# Patient Record
Sex: Female | Born: 1935 | Race: White | Hispanic: No | Smoking: Former smoker
Health system: Southern US, Community
[De-identification: ages and names within clinical notes are randomized; demographics above are authoritative.]

## PROBLEM LIST (undated history)

## (undated) ENCOUNTER — Emergency Department (HOSPITAL_COMMUNITY): Discharge status: Against Medical Advice | Payer: Self-pay

## (undated) DIAGNOSIS — J9801 Acute bronchospasm: Secondary | ICD-10-CM

## (undated) DIAGNOSIS — E785 Hyperlipidemia, unspecified: Secondary | ICD-10-CM

## (undated) DIAGNOSIS — R0489 Hemorrhage from other sites in respiratory passages: Secondary | ICD-10-CM

## (undated) DIAGNOSIS — I10 Essential (primary) hypertension: Secondary | ICD-10-CM

## (undated) DIAGNOSIS — B029 Zoster without complications: Secondary | ICD-10-CM

## (undated) DIAGNOSIS — T7840XA Allergy, unspecified, initial encounter: Secondary | ICD-10-CM

## (undated) HISTORY — DX: Hemorrhage from other sites in respiratory passages: R04.89

## (undated) HISTORY — DX: Zoster without complications: B02.9

## (undated) HISTORY — DX: Allergy, unspecified, initial encounter: T78.40XA

## (undated) HISTORY — DX: Acute bronchospasm: J98.01

---

## 1997-10-12 ENCOUNTER — Ambulatory Visit (HOSPITAL_COMMUNITY): Admission: RE | Admit: 1997-10-12 | Discharge: 1997-10-12 | Payer: Self-pay | Admitting: Obstetrics & Gynecology

## 1998-07-19 ENCOUNTER — Other Ambulatory Visit: Admission: RE | Admit: 1998-07-19 | Discharge: 1998-07-19 | Payer: Self-pay | Admitting: *Deleted

## 1999-08-23 ENCOUNTER — Other Ambulatory Visit: Admission: RE | Admit: 1999-08-23 | Discharge: 1999-08-23 | Payer: Self-pay | Admitting: *Deleted

## 2000-11-20 ENCOUNTER — Emergency Department (HOSPITAL_COMMUNITY): Admission: EM | Admit: 2000-11-20 | Discharge: 2000-11-20 | Payer: Self-pay | Admitting: Internal Medicine

## 2000-12-12 ENCOUNTER — Ambulatory Visit (HOSPITAL_COMMUNITY): Admission: RE | Admit: 2000-12-12 | Discharge: 2000-12-12 | Payer: Self-pay | Admitting: Family Medicine

## 2001-07-15 DIAGNOSIS — R0489 Hemorrhage from other sites in respiratory passages: Secondary | ICD-10-CM

## 2001-07-15 HISTORY — DX: Hemorrhage from other sites in respiratory passages: R04.89

## 2001-11-26 ENCOUNTER — Encounter: Payer: Self-pay | Admitting: Emergency Medicine

## 2001-11-26 ENCOUNTER — Emergency Department (HOSPITAL_COMMUNITY): Admission: EM | Admit: 2001-11-26 | Discharge: 2001-11-26 | Payer: Self-pay | Admitting: Emergency Medicine

## 2001-11-27 ENCOUNTER — Encounter (INDEPENDENT_AMBULATORY_CARE_PROVIDER_SITE_OTHER): Payer: Self-pay | Admitting: Specialist

## 2001-11-27 ENCOUNTER — Encounter: Payer: Self-pay | Admitting: Internal Medicine

## 2001-11-27 ENCOUNTER — Encounter (INDEPENDENT_AMBULATORY_CARE_PROVIDER_SITE_OTHER): Payer: Self-pay | Admitting: *Deleted

## 2001-11-27 ENCOUNTER — Inpatient Hospital Stay (HOSPITAL_COMMUNITY): Admission: EM | Admit: 2001-11-27 | Discharge: 2001-12-08 | Payer: Self-pay | Admitting: Emergency Medicine

## 2001-11-28 ENCOUNTER — Encounter: Payer: Self-pay | Admitting: Pulmonary Disease

## 2001-11-28 ENCOUNTER — Encounter: Payer: Self-pay | Admitting: Thoracic Surgery

## 2001-11-29 ENCOUNTER — Encounter: Payer: Self-pay | Admitting: Internal Medicine

## 2001-11-29 ENCOUNTER — Encounter: Payer: Self-pay | Admitting: Thoracic Surgery

## 2001-11-30 ENCOUNTER — Encounter: Payer: Self-pay | Admitting: Pulmonary Disease

## 2001-12-01 ENCOUNTER — Encounter: Payer: Self-pay | Admitting: Pulmonary Disease

## 2001-12-02 ENCOUNTER — Encounter: Payer: Self-pay | Admitting: Pulmonary Disease

## 2001-12-03 ENCOUNTER — Encounter: Payer: Self-pay | Admitting: Internal Medicine

## 2001-12-05 ENCOUNTER — Encounter: Payer: Self-pay | Admitting: Internal Medicine

## 2001-12-06 ENCOUNTER — Encounter: Payer: Self-pay | Admitting: Pulmonary Disease

## 2002-05-18 ENCOUNTER — Encounter: Admission: RE | Admit: 2002-05-18 | Discharge: 2002-05-18 | Payer: Self-pay | Admitting: Internal Medicine

## 2002-05-18 ENCOUNTER — Encounter: Payer: Self-pay | Admitting: Internal Medicine

## 2003-06-26 ENCOUNTER — Emergency Department (HOSPITAL_COMMUNITY): Admission: EM | Admit: 2003-06-26 | Discharge: 2003-06-26 | Payer: Self-pay | Admitting: Emergency Medicine

## 2003-07-26 ENCOUNTER — Other Ambulatory Visit: Admission: RE | Admit: 2003-07-26 | Discharge: 2003-07-26 | Payer: Self-pay | Admitting: Gynecology

## 2004-06-12 ENCOUNTER — Ambulatory Visit: Payer: Self-pay | Admitting: Pulmonary Disease

## 2004-07-20 ENCOUNTER — Ambulatory Visit: Payer: Self-pay | Admitting: Pulmonary Disease

## 2004-07-27 ENCOUNTER — Ambulatory Visit: Admission: RE | Admit: 2004-07-27 | Discharge: 2004-07-27 | Payer: Self-pay | Admitting: Pulmonary Disease

## 2004-08-09 ENCOUNTER — Ambulatory Visit: Payer: Self-pay | Admitting: Pulmonary Disease

## 2004-08-16 ENCOUNTER — Other Ambulatory Visit: Admission: RE | Admit: 2004-08-16 | Discharge: 2004-08-16 | Payer: Self-pay | Admitting: Obstetrics and Gynecology

## 2004-09-02 ENCOUNTER — Emergency Department (HOSPITAL_COMMUNITY): Admission: EM | Admit: 2004-09-02 | Discharge: 2004-09-03 | Payer: Self-pay | Admitting: Emergency Medicine

## 2006-09-17 ENCOUNTER — Ambulatory Visit: Payer: Self-pay | Admitting: Pulmonary Disease

## 2006-09-29 ENCOUNTER — Ambulatory Visit: Payer: Self-pay

## 2006-09-29 ENCOUNTER — Encounter: Payer: Self-pay | Admitting: Cardiology

## 2006-10-09 ENCOUNTER — Ambulatory Visit: Payer: Self-pay | Admitting: Pulmonary Disease

## 2006-11-13 ENCOUNTER — Ambulatory Visit: Payer: Self-pay | Admitting: Internal Medicine

## 2006-11-20 ENCOUNTER — Ambulatory Visit: Payer: Self-pay

## 2006-12-09 ENCOUNTER — Ambulatory Visit: Payer: Self-pay | Admitting: Internal Medicine

## 2006-12-18 ENCOUNTER — Ambulatory Visit: Payer: Self-pay | Admitting: Internal Medicine

## 2006-12-18 LAB — CONVERTED CEMR LAB
CO2: 31 meq/L (ref 19–32)
Calcium: 9.9 mg/dL (ref 8.4–10.5)
Chloride: 101 meq/L (ref 96–112)
Creatinine, Ser: 1 mg/dL (ref 0.4–1.2)
Glucose, Bld: 95 mg/dL (ref 70–99)

## 2006-12-24 ENCOUNTER — Ambulatory Visit: Payer: Self-pay | Admitting: Internal Medicine

## 2006-12-24 ENCOUNTER — Ambulatory Visit: Payer: Self-pay | Admitting: Cardiology

## 2006-12-24 LAB — CONVERTED CEMR LAB
CO2: 30 meq/L (ref 19–32)
Chloride: 98 meq/L (ref 96–112)
Creatinine, Ser: 0.9 mg/dL (ref 0.4–1.2)
Glucose, Bld: 101 mg/dL — ABNORMAL HIGH (ref 70–99)
Sodium: 136 meq/L (ref 135–145)

## 2007-09-07 ENCOUNTER — Ambulatory Visit: Payer: Self-pay | Admitting: Internal Medicine

## 2009-05-29 ENCOUNTER — Inpatient Hospital Stay (HOSPITAL_COMMUNITY): Admission: EM | Admit: 2009-05-29 | Discharge: 2009-05-31 | Payer: Self-pay | Admitting: Emergency Medicine

## 2009-09-19 ENCOUNTER — Encounter (INDEPENDENT_AMBULATORY_CARE_PROVIDER_SITE_OTHER): Payer: Self-pay | Admitting: *Deleted

## 2009-11-24 ENCOUNTER — Ambulatory Visit: Payer: Self-pay | Admitting: Emergency Medicine

## 2009-11-24 DIAGNOSIS — I10 Essential (primary) hypertension: Secondary | ICD-10-CM

## 2009-11-24 DIAGNOSIS — R042 Hemoptysis: Secondary | ICD-10-CM | POA: Insufficient documentation

## 2009-11-24 DIAGNOSIS — J449 Chronic obstructive pulmonary disease, unspecified: Secondary | ICD-10-CM

## 2009-11-24 DIAGNOSIS — J4489 Other specified chronic obstructive pulmonary disease: Secondary | ICD-10-CM | POA: Insufficient documentation

## 2009-11-24 DIAGNOSIS — E785 Hyperlipidemia, unspecified: Secondary | ICD-10-CM

## 2010-08-14 NOTE — Letter (Signed)
Summary: New Patient letter  [facility]  [address] [place], [place] [address]   Phone: [phone]  Fax: [phone]       09/19/2009 MRN: [id number]  [facility] [address] [place], [place]  [address]  Dear Ms. [name],  Welcome to the Gastroenterology Division at [facility].    You are scheduled to see Dr. Christella Hartigan on 10-16-09 at 2:30p.m. on the 3rd floor at [facility], [address].  We ask that you try to arrive at our office 15 minutes prior to your appointment time to allow for check-in.  We would like you to complete the enclosed self-administered evaluation form prior to your visit and bring it with you on the day of your appointment.  We will review it with you.  Also, please bring a complete list of all your medications or, if you prefer, bring the medication bottles and we will list them.  Please bring your insurance card so that we may make a copy of it.  If your insurance requires a referral to see a specialist, please bring your referral form from your primary care physician.  Co-payments are due at the time of your visit and may be paid by cash, check or credit card.     Your office visit will consist of a consult with your physician (includes a physical exam), any laboratory testing he/she may order, scheduling of any necessary diagnostic testing (e.g. x-ray, ultrasound, CT-scan), and scheduling of a procedure (e.g. Endoscopy, Colonoscopy) if required.  Please allow enough time on your schedule to allow for any/all of these possibilities.    If you cannot keep your appointment, please call [phone] to cancel or reschedule prior to your appointment date.  This allows [place] the opportunity to schedule an appointment for another patient in need of care.  If you do not cancel or reschedule by 5 p.m. the business day prior to your appointment date, you will be charged a $50.00 late cancellation/no-show fee.    Thank you for choosing  [facility] for your medical needs.  We appreciate the opportunity to care for you.  Please visit [place] at our website  to learn more about our practice.                     Sincerely,                                                             The Gastroenterology Division

## 2010-08-14 NOTE — Assessment & Plan Note (Signed)
Summary: hemoptysis   Visit Type:  Initial Consult Copy to:  self referral Primary Provider/Referring Provider:  Dr. Nicholos Johns  CC:  Pulmonary Consult for hemoptysis.  Former Publishing copy pt..  History of Present Illness: 77 yo former smoker, hx of HTN, prior episode of massive hemoptysis in 2003. Also with mild AFL documented in 09/2004 by spirometry. Never has benefitted from BD's. Tells me that she hasn't had any more hemoptysis or exacerbations since that hospitalization. FOB was unremarkable at that time.   Tells me that she had an episode of hemoptysis, about 1 tablespoon, two weeks ago. None since. No CP, now SOB. She didn't have any prodrome, no URI signs, occas cough but no increase from baseline.   Preventive Screening-Counseling & Management  Alcohol-Tobacco     Smoking Status: quit  Current Medications (verified): 1)  Lipitor 20 Mg Tabs (Atorvastatin Calcium) .Marland Kitchen.. 1 By Mouth Once Daily 2)  Multivitamins   Tabs (Multiple Vitamin) .Marland Kitchen.. 1 By Mouth Once Daily 3)  Avapro 300 Mg Tabs (Irbesartan) .... Once Daily 4)  Combigan 0.2-0.5 % Soln (Brimonidine Tartrate-Timolol) .Marland Kitchen.. 1 Gtt Each Eye Two Times A Day  Allergies (verified): 1)  ! * Ivp Dye 2)  ! Pcn  Past History:  Past Medical History: Hyperlipidemia Hypertension Probable transient global amnesia Obesity Branch retinal vein occlusion on the right eye Glaucoma Hx pulmonary hemorrhage 2003 resulting in VDRF      - FOB x 2 at that time negative      - ANA, ANCA, ESR unremarkable 2003  Family History: allergies-daughter brother deceased from lung ca stroke - sister  Social History: Patient states former smoker.  Quit in 2003.  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  Former Publishing copy pt. Comments Medications reviewed with patient Daytime contact number verified with patient. Gweneth Dimitri RN  Nov 24, 2009 9:40 AM    Physical Exam  General:  pleasant elderly woman, normal appearance and healthy appearing.  Overweight Head:  normocephalic and atraumatic Nose:  no deformity, discharge, inflammation, or lesions; no blood Mouth:  no deformity or lesions, normal posterior pharynx Neck:  no stridor Lungs:  clear bilaterally to auscultation and percussion Heart:  regular rate and rhythm, S1, S2 without murmurs, rubs, gallops, or clicks Abdomen:  not examined Extremities:  no clubbing, cyanosis, edema, or deformity noted Neurologic:  non-focal Skin:  intact without lesions or rashes Psych:  alert and cooperative; normal mood and affect; normal attention span and concentration   Impression & Recommendations:  Problem # 1:  HEMOPTYSIS UNSPECIFIED (ICD-786.30) Single episode 2 weeks ago in absence of CP or SOB. DDx includes PE, but doubt in absence of other symptoms. Also consider endobronchial lesion, malignancy. She does not have a bronchitis or other process that would seem to incite bleeding. Hx of massive hemoptysis in 2003 without abnormality on FOB.  We discussed options conservative vs aggressive - CT-PA and FOB now vs watch and wait to see if it happens again. She would like to wait on Ct or FOB at this time - CXR now - if bleeding occurs again then will arrange for FOB and probably CT-PA. - f/u as needed   Problem # 2:  COPD, MILD (ICD-496)  Medications Added to Medication List This Visit: 1)  Avapro 300 Mg Tabs (Irbesartan) .... Once daily 2)  Combigan 0.2-0.5 % Soln (Brimonidine tartrate-timolol) .Marland Kitchen.. 1 gtt each eye two times  a day  Other Orders: Consultation Level IV (99244) T-2 View CXR (71020TC)  Patient Instructions: 1)  CXR today showed no abnormalities 2)  Please call Dr Delton Coombes immediately if you cough any more blood. If so we will arrange for testing to investigate further.  3)  Follow up with Dr Delton Coombes as needed    Immunization History:  Influenza Immunization History:    Influenza:  historical (04/14/2009)  Pneumovax Immunization History:    Pneumovax:  historical (04/14/2008)

## 2010-10-17 LAB — BASIC METABOLIC PANEL
Calcium: 9.5 mg/dL (ref 8.4–10.5)
Creatinine, Ser: 0.87 mg/dL (ref 0.4–1.2)
GFR calc Af Amer: >60 mL/min (ref >60)
GFR calc non Af Amer: >60 mL/min (ref >60)
Glucose, Bld: 108 mg/dL — ABNORMAL HIGH (ref 70–99)
Sodium: 138 mEq/L (ref 135–145)

## 2010-10-17 LAB — DIFFERENTIAL
Basophils Absolute: 0.1 10*3/uL (ref 0.0–0.1)
Basophils Relative: 1 % (ref 0–1)
Lymphocytes Relative: 16 % (ref 12–46)
Monocytes Absolute: 0.6 10*3/uL (ref 0.1–1.0)
Neutro Abs: 5.6 10*3/uL (ref 1.7–7.7)
Neutrophils Relative %: 72 % (ref 43–77)

## 2010-10-17 LAB — SALICYLATE LEVEL: Salicylate Lvl: <4 mg/dL (ref 2.8–20.0)

## 2010-10-17 LAB — AMMONIA: Ammonia: 15 umol/L (ref 11–35)

## 2010-10-17 LAB — URINE MICROSCOPIC-ADD ON

## 2010-10-17 LAB — POCT CARDIAC MARKERS
Myoglobin, poc: 68.6 ng/mL (ref 12–200)
Troponin i, poc: <0.05 ng/mL (ref 0.00–0.09)

## 2010-10-17 LAB — URINALYSIS, ROUTINE W REFLEX MICROSCOPIC
Bilirubin Urine: NEGATIVE
Glucose, UA: NEGATIVE mg/dL
Ketones, ur: NEGATIVE mg/dL
Specific Gravity, Urine: 1.01 (ref 1.005–1.030)
pH: 5.5 (ref 5.0–8.0)

## 2010-10-17 LAB — CBC
Hemoglobin: 12.4 g/dL (ref 12.0–15.0)
MCHC: 34.8 g/dL (ref 30.0–36.0)
RDW: 13.6 % (ref 11.5–15.5)

## 2010-11-27 NOTE — Assessment & Plan Note (Signed)
[facility]                            CARDIOLOGY OFFICE NOTE   [name], [name]                        MRN:          [id number]  DATE:12/09/2006                            DOB:          1923-07-30    PRIMARY CARE PHYSICIAN:  [name], M.D.   PULMONOLOGIST:  Isabel Shelter, MD   INTERVAL HISTORY:  [name] is a delightful 77 year old woman with a  history of obesity, hyperlipidemia and carotid stenosis and hemorrhagic  bronchitis. She returns today for followup on her dyspnea on exertion.   Since we last saw her, she had exercise Myoview, which showed an EF of  78% with no infarct or ischemia. Also, had an echocardiogram with showed  an EF of 70% with mild mitral and aortic valvular regurgitation. She  says that she feels much better over the past few weeks after she lost a  few pounds. She can now walk up to 2 miles without any significant  dyspnea. She denies any chest pain. She recently developed a severe  hacking cough and saw Dr.  Nicholos Melton today and he started her on some  antibiotics and nasal inhaler.   She denies any lower extremity edema. Has no orthopnea or PND.   CURRENT MEDICATIONS:  1. Lipitor 20 mg a day.  2. Qvar.  3. Maxair.   PHYSICAL EXAMINATION:  She looks much younger than her stated age. She  ambulates around the clinic without any respiratory difficulty. She has  a severe hacking cough which is currently non-productive. Current blood  pressure is 182/86, heart rate 82, weight is 179.  HEENT: Normal.  NECK: is supple. JVP is about 6-7 cm of water.  Her carotids are 2+  bilaterally without bruits. There is no lymphadenopathy, thyromegaly.  CARDIAC: Is a regular rate and a rhythm with an S4. No murmurs. PMI is  nondisplaced.  LUNGS:  Are clear with a mild end-expiratory wheeze.  ABDOMEN: Obese, nontender, nondistended. No hepatosplenomegaly. No  bruits. No masses. Good bowel sounds.  EXTREMITIES:  Are warm with no cyanosis, clubbing or edema. No rash.  NEURO: She is alert and oriented x3. Cranial nerves II-XII are intact.  Moves all 4 extremities without difficulty. Affect is very pleasant.   ASSESSMENT/PLAN:  1. Dyspnea. This seems to be improved. Her stress test and      echocardiogram are normal. Since she is feeling better, we will      just see her back in six months. Should her dyspnea return, we can      get a cardiopulmonary exercise test to further evaluate.  2. Hypertension. Blood pressure is severely elevated. We had a long      discussion about the need for anti-hypertensive therapy, which she      is quite reluctant. We will go ahead and start her on lisinopril      20, hydrochlorothiazide 12.5 as well as 20 of potassium. Will check      her BMET next week. I have asked her to followup with Dr.      Nicholos Melton. However, she will be in  [place] all summer so I have      asked her to followup with her children's physician up there for at      least a blood pressure check.   DISPOSITION:  We will see her back in six months.     Isabel Buckles. Bensimhon, MD  Electronically Signed    DRB/MedQ  DD: 12/09/2006  DT: 12/09/2006  Job #: [id number]   cc:   [name], M.D.  Isabel Shelter, MD

## 2010-11-27 NOTE — Assessment & Plan Note (Signed)
[facility]                            CARDIOLOGY OFFICE NOTE   SHADIAMOND, KOSKA                        MRN:          [id number]  DATE:11/13/2006                            DOB:          [birth year]    PRIMARY CARE PHYSICIAN:  Dr. Georgianne Fick.   REASON FOR CONSULTATION:  Dyspnea on exertion.   HISTORY OF PRESENT ILLNESS:  [name] is a delightful 77 year old  woman with a history of obesity, hyperlipidemia, and carotid artery  stenosis. She denies any history of known coronary artery disease. She  did have a stress test about 15 years ago which she said was negative.  She has never had a cardiac catheterization. She also has a history of  asthmatic bronchitis and COPD. In 2003 she had an episode of profound  hemorrhagic bronchitis. Fortunately this has not recurred.   She tells me that over the past year she has noticed progressive dyspnea  on exertion. Apparently 1 year ago she was able to walk 3 to 3 and a  half miles a day in about 45 minutes or so without any difficultly. Now  she says that she has problems 1 mile in 20 minutes and her former  walking partner does not walk with her anymore because she goes too  slow. She denies any related chest pain or pressure. She has not had any  lower extremity edema, no orthopnea, no PND. She underwent an  echocardiogram which showed an ejection fraction of 70% with mild mitral  regurgitation and aortic insufficiency. I have reviewed the study myself  and there is evidence of diastolic dysfunction with elevated left  ventricular filling pressures. Estimated TA systolic pressure was 25  millimeters of mercury.   Looking over her records, she did undergo a CPX study in January of 2006  which showed a normal peak __________ with no evidence of  cardiopulmonary limitation. Thus I suspect her complaints of dyspnea are  actually more than the year that she states. Also she had recent PFTs  which  were normal.   REVIEW OF SYSTEMS:  Notable for gastroesophageal reflux disease and some  arthritis pain. She denies any snoring. No fevers, chills. No neurologic  defects. She does have occasional pain in her calves while walking. The  remainder of review of systems is negative except for HPI and problem  list.   PROBLEM LIST:  1. Chronic obstructive pulmonary disease.      a.     History of hemorrhagic bronchitis in 2003.  2. Obesity.  3. Dyspnea on exertion.  4. Hyperlipidemia.  5. Carotid artery stenosis, status post right endarterectomy.  6. Gastroesophageal reflux disease.   CURRENT MEDICATIONS:  1. Lipitor 20 mg a day.  2. Multivitamins.  3. Alphagan eye drops.   ALLERGIES:  IV DYE AND PENICILLIN.   SOCIAL HISTORY:  She is retired. She is originally from [place] and moved  down here with her husband who is now deceased. History of tobacco but  quit in May of 2003. Does not drink alcohol.   On physical exam, she is a very  pleasant woman in no acute distress.  Ambulates around the clinic without any respiratory difficultly. Blood  pressure is 150/72, heart rate is 91, her weight is 178.  HEENT: Normal.  NECK: Supple. JVP is about 6 cm of water. Carotids are 2 + bilaterally  without any bruits. There is a well-healed CEA scar on the right. There  is no lymphadenopathy or thyromegaly.  CARDIAC: She has regular rate and rhythm with a soft S4. No murmur.  LUNGS: Clear. No wheezing.  ABDOMEN: Obese, nontender, nondistended. No hepatosplenomegaly. No  bruits. No masses. Good bowel sounds.  EXTREMITIES: Warm with no cyanosis, clubbing, or edema. There is no  rash.  NEURO: She is alert and oriented x3. Cranial nerves II-XII are intact.  Moves all 4 extremities without difficultly. Affect is pleasant.   EKG: Shows normal sinus rhythm rate of 84. There is a regular axis and  intervals. No significant ST-T wave abnormalities.   ASSESSMENT/PLAN:  Dyspnea on exertion, I suspect  this is due to a  combination of her age, diastolic dysfunction, and obesity. However, she  does have multiple risk factors for coronary artery disease including  documented carotid artery stenosis. At this point I think it is  reasonable to proceed with a treadmill Myoview to further evaluate for  ischemia. If this is positive she will obviously need cardiac  catheterization. If it is negative we will plan to proceed with repeat  cardiopulmonary exercise testing to reevaluate.     [name], MD     DRB/MedQ  DD: 11/13/2006  DT: 11/13/2006  Job #: [id number]   cc:   Gailen Shelter, MD

## 2010-11-27 NOTE — Assessment & Plan Note (Signed)
[facility]                            CARDIOLOGY OFFICE NOTE   [name], [name]                        MRN:          [id number]  DATE:09/07/2007                            DOB:          [birth year]    PRIMARY CARE PHYSICIAN:  Georgianne Fick, M.D.   INTERVAL HISTORY:  Ms. Hinderliter is a delightful 77 year old with a  history of obesity, hyperlipidemia, carotid stenosis and hemorrhagic  bronchitis.  We have seen her in the past for dyspnea on exertion.  She  had a normal echo and Myoview back in May 2008.  Ejection fraction was  70%.  There is mild aortic and mitral valve regurgitation.   She returns today for routine follow-up.  She says she is feeling great.  Denies any chest pain or shortness of breath.  She has not had lower  extremity edema.  Blood pressure has been well controlled.  She said she  did get an ultrasound of her carotid while she was in [place], supposedly  there was some mild abnormality on the left, but this was not severe.  She is going to get me the report.   She did have problems with cough on lisinopril and had to stop this.   CURRENT MEDICATIONS:  1. Lipitor 20 mg a day.  2. Eye drops.  3. Avalide 150/12.5 a day.   MEDICATION INTOLERANCES:  ACE INHIBITORS which causes cough.  IODINE  causes hives.  PENICILLIN causes hives.   PHYSICAL EXAMINATION:  GENERAL APPEARANCE:  She is an elderly woman in  no acute distress, ambulates around the clinic without any respiratory  difficulty.  VITAL SIGNS:  Blood pressure is 122/60, heart rate 74, weight 173.  HEENT is normal.  NECK:  Supple.  There is no JVD.  Carotid are 2+ bilaterally.  No  bruits.  I do not hear anything on the left.  There is no  lymphadenopathy or thyromegaly.  CARDIAC:  Regular rate and rhythm with an S4.  No murmurs, rubs or  gallops appreciated.  LUNGS:  Clear.  ABDOMEN:  Soft, nontender, nondistended, obese, no hepatosplenomegaly,  no bruits, no  masses.  EXTREMITIES:  Warm with no cyanosis, clubbing or edema.  No rash.  NEURO:  Alert and oriented x3.  Cranial nerves II-XII are intact.  Moves  all four extremities without difficulty.  Affect is pleasant.   EKG shows normal sinus rhythm, rate of 74, no ST-T wave abnormalities.   ASSESSMENT/PLAN:  1. Chest pain and dyspnea.  This is resolved.  She has a normal stress      test and echo.  She does not need any further workup.  2. Hypertension, well controlled.   DISPOSITION:  She is doing well.  We will see her back in one year for  routine follow-up.  I did tell her to please get me the copy of her  carotid ultrasound so we can follow up as needed.  She said she would  mail it to the office.     [name], MD  Electronically Signed    DRB/MedQ  DD:  09/07/2007  DT: 09/08/2007  Job #: [id number]   cc:   Georgianne Fick, M.D.

## 2010-11-30 NOTE — Op Note (Signed)
NAME:  [name], [name] NO.:  [id number]   MEDICAL RECORD NO.:  [id number]          PATIENT TYPE:  OUT   LOCATION:  CARD                         FACILITY:  [facility]   PHYSICIAN:  Oley Balm. Sung Amabile, M.D. [facility] OF BIRTH:  September 20, 1923   DATE OF PROCEDURE:  07/27/2004  DATE OF DISCHARGE:  07/27/2004                                 OPERATIVE REPORT   INDICATION:  Unexplained exertional dyspnea.   PROCEDURE:  Cardiopulmonary stress test was performed on a graded treadmill.  Testing was stopped due to shortness of breath and heart rate goal. Effort  was maximal. At peak exercise, oxygen uptake was 1.59 liters per minute or  105% of predicted maximum indicating normal exercise tolerance.   At peak exercise, heart rate was 163 beats per minute or 114% of predicted  maximum indicating that cardiovascular limitation was reached. Oxygen pulse  was normal suggesting normal left ventricular function. Blood pressure  response was normal. EKG tracings revealed poor R-wave progression at  baseline. Otherwise, no evidence of ischemia or arrhythmias was noted.   At peak exercise, minute ventilation was 51.4 liters per minute or 62% of  MVV.  This indicated ventilatory reserve remained. Gas exchange parameters  revealed no abnormalities. Baseline spirometry revealed probably no  abnormalities. There might have been minimal obstructive lung disease but  FEV-1 was normal. Postexercise spirometry was not done.   SUMMARY:  Normal exercise tolerance. Normal cardiopulmonary response to  exercise. No evidence of cardiac or pulmonary pathology.      DBS/MEDQ  D:  08/02/2004  T:  08/02/2004  Job:  [id number]   cc:   Dr. Jayme Cloud

## 2010-11-30 NOTE — Assessment & Plan Note (Signed)
[facility]                             PULMONARY OFFICE NOTE   [name], [name]                        MRN:          [id number]  DATE:09/17/2006                            DOB:          30-Jan-1924    This is a very pleasant 77 year old female whom I have not seen here  since August 09, 2004.  The patient previously had followed here for  issues with dyspnea and normal cardiopulmonary stress test performed on  July 27, 2004.  The patient has longstanding history of cigarette  abuse but discontinued this habit several years ago.  She did have an  episode of hemorrhagic bronchitis which had her ventilatory dependent  due to respiratory failure secondary to the same.  This was in May of  2003.  She has had no issues with that since then.  The patient presents  today because of difficulties with dyspnea on exertion.  They have been  accentuated over the last 3 months.  She failed to follow up as  scheduled previously.  Note again, that she did have a totally normal  cardiopulmonary stress test for somebody her age done in January of  2006.  The patient does describe wheezing on exertion and does describe  a dry cough.  She denies any other symptomatology.  Denies any chest  pain.  She has not exhibited paroxysmal nocturnal dyspnea.   CURRENT MEDICATIONS:  Include Lipitor 20 mg daily.  She is currently on  some prednisone eye drops twice a day due to issues post cataract  extraction.  She is currently on no inhalation therapy and has had a  trial of inhalers in the past but has discontinued these.   PHYSICAL EXAMINATION:  Blood pressure was noted to be at 178/92, pulse  of 90, oxygen saturation of 94%, temperature of 98 and a weight of 185  pounds.  GENERAL:  This is an obese female who is in no acute distress.  She is  awake, alert, oriented x4 and she is fully ambulatory.  HEENT:  Examination reveals some periorbital edema.  Neck is supple.  She has full neck veins but no frank JVD.  LUNGS:  With some end-expiratory wheezes noted and a few rhonchi.  CARDIAC EXAMINATION:  Regular rate and rhythm.  No rubs, murmurs or  gallops heard.  EXTREMITIES:  The patient has trace pedal edema.  NEUROLOGICAL:  Examination was grossly nonfocal.   We did perform spirometry today which was remarkable for preserved FEV1;  however, the patient does have great decrease in her mid flows at 66% of  predicted.  This is a marked variation from her prior studies, and given  her bronchospasm clinically, she may have some issues with airway  reactivity.   We did perform ambulatory oximetry.  Blood pressure before initiation  was 160/84.  The patient had a heart rate at rest of 80.  This increased  to 132 with minimal ambulation.  Initial oxygen saturation was 99%.  This dropped to 92% but did not drop any further during exercise.  Blood  pressure afterwards was 198/90.  This raises the issues whether the  patient may be having some issues with diastolic dysfunction as part of  her subjective noted wheezing during exertion.   IMPRESSION:  1. Dyspnea on exertion which could be related to either diastolic      dysfunction versus airway reactivity and mild obstructive lung      disease.  2. Hypertension:  This will be a new diagnosis for the patient.   PLAN:  1. Will be to obtain a 2-D echocardiogram.  This will allow [place] to      evaluate whether the patient has issues with diastolic dysfunction.      If this is the case, I would recommend starting her on Bystolic 5      mg for control of her blood pressure and her diastolic dysfunction      issues.  She may also as well require very low dose diuretic.  2. The patient will be started on Symbicort 80/4.5 two inhalations      twice a day for management of her airway reactivity.  3. Follow-up will be in 2-4 weeks' time.  She is to contact us prior      to that time should any new problems rise.      Gailen Shelter, MD  Electronically Signed    CLG/MedQ  DD: 09/17/2006  DT: 09/18/2006  Job #: [id number]

## 2010-11-30 NOTE — Procedures (Signed)
[place]. [facility]  Patient:    [name], [name] Visit Number: [id number] MRN: [id number]          Service Type: MED Location: [phone] Attending Physician:  Jetty Duhamel Driver Dictated by:   Oley Balm Sung Amabile, M.D. [facility] Proc. Date: 11/27/01 Admit Date:  11/27/2001   CC:         Clinton D. Maple Hudson, M.D.  D. Karle Plumber, M.D.   Procedure Report  PROCEDURE:  Bronchoscopy.  DATE OF BIRTH:  Feb 16, 2024  INDICATIONS:  Massive hemoptysis.  PREMEDICATION:  Demerol 50 mg IV, Phenergan 12.5 mg IV, Versed 6 mg IV.  ENDOSCOPIST:  Oley Balm. Simonds, M.D.  DESCRIPTION OF PROCEDURE:  After adequate sedation and anesthesia, the bronchoscope was introduced via the right nares and advanced into the posterior pharynx.  This demonstrated a normal upper airway anatomy.  Vocal cords moved symmetrically.  Further anesthesia was achieved with 1% lidocaine, and a scope was advanced into the trachea.  Complete airway anesthesia was achieved with 1% lidocaine, and an airway examination was performed.  This demonstrated normal segmental anatomy on the left.  Upon entering the right mainstem bronchus, there was noted to be acute clot obstructing the bronchus. Initially, this had the appearance of an endobronchial tumor.  The clot completely obstructed the bronchus intermedius, but the scope could be manipulated into the right upper lobe and right middle lobe.  There appeared to be no bleeding from these lobes.  After completion of the airway examination, washings brushings, and endobronchial biopsy were obtained.  The clot could not be successfully extracted through the bronchoscope and, therefore, the procedure was terminated.  At the time of completion of the bronchoscopy, there was no evidence of active bleeding.  The above specimens were sent for cytology and surgical pathology.  The patient tolerated the procedure well and returned to her hospital room shortly after  completion of the procedure. Dictated by:   Oley Balm Sung Amabile, M.D. [facility] Attending Physician:  Jetty Duhamel Driver DD:  08/65/78 TD:  12/07/01 Job: [id number]

## 2010-11-30 NOTE — Discharge Summary (Signed)
[place]. [facility]  Patient:    [name], [name] Visit Number: [id number] MRN: [id number]          Service Type: MED Location: [phone] Attending Physician:  Jetty Duhamel Driver Dictated by:   Earley Favor, RN, MSN, ACNP Admit Date:  11/27/2001 Disc. Date: 12/08/01   CC:         [name], M.D.  [name], M.D., [facility], [place], [place]  [name], M.D., [facility], [place], [place]   Discharge Summary  DATE OF BIRTH:  1924-01-19  DISCHARGE DIAGNOSES: 1. Massive hemoptysis. 2. Ventilator-dependent respiratory failure secondary to #1. 3. Thrush and thrush was addressed with Magic Mouthwash.  HISTORY OF PRESENT ILLNESS:  [name] is a 77 year old Caucasian female who presented to [facility] via the emergency department with second presentation with one cup of hemoptysis.  She had a sensation of choking.  Of note, she had a similar episode in May, approximately two years preceding, that required fiberoptic bronchoscopy which showed bronchitis and bronchial irritation.  Of note, she is a smoker and has continued to smoke, despite having hemoptysis on the third episode.  PROCEDURE DATA: 1. Orotracheal intubation on Nov 28, 2001 with extubation on Dec 04, 2001. 2. She had fiberoptic bronchoscopy performed by Dr. Danice Melton and    Dr. Oley Melton. Simonds, along with Dr. Norton Melton; the first one was    Nov 27, 2001.  At that time, she required orotracheal intubation.  The    second one on Dec 02, 2001 demonstrated chronic bronchitis changes in the    right upper and right middle lobes.  No arteriovenous malformations could    be seen.  No endobronchial lesions were noted.  Hemoptysis was due    secondary to a hemorrhagic bronchitis.  LABORATORY AND ACCESSORY DATA:  Sodium 136, potassium 4.0, chloride 97, CO2 of 32, glucose 104, BUN was 28, creatinine 0.8, calcium was 8.6, magnesium  was 2. WBC was 14.8, hemoglobin 11.2, hematocrit 32.6, platelets are 302,000. Arterial blood gases on a 40% pressure regulator, volume control, rate of 14, tidal volume of 600, 5 of PEEP:  PCO2 of 47, PO2 of 69, 7.44 was pH.  ESR was 17.  INR was 1.0.  PTT was 26.  TSH was 3.64.  Prealbumin was 20.2. Urinalysis unremarkable.  His respiratory culture demonstrates no growth. Surgical pathology demonstrates benign respiratory mucosa, fragments of lung parenchyma with hemorrhage, no neoplasm or granuloma identified.  Radiographic data:  Chest x-ray shows mild peribronchial thickening, relatively unchanged chest.  HOSPITAL COURSE: #1 - MASSIVE HEMOPTYSIS:  The patient was admitted to [facility] and underwent fiberoptic bronchoscopy.  Unfortunately, she continued to have hemoptysis involving increasing hypoxemia and required transfer to intensive care unit on Nov 28, 2001 and with intubation and mechanical ventilatory support until Dec 04, 2001.  She underwent fiberoptic bronchoscopies x2 showing massive hemoptysis with bronchitic changes without evidence of neoplasms.  No arteriovenous malformations could be identified.  She was successfully liberated from the mechanical ventilatory support with no further hemoptysis following extubation.  #2 - ANEMIA:  Anemia is secondary to blood loss.  She required transfusion of packed red blood cells to normalize her hemoglobin and hematocrit.  #3 - COPD, CHRONIC BRONCHITIS AND ASTHMATIC BRONCHITIS:  She was placed on steroids along with nebulized bronchodilators.  She reached maximal benefit and was ready for discharge home on Dec 08, 2001.  She will be continued on  Combivent two puffs four times a day via spacer.  Her steroids will be tapered until off.  #4 - STEROID-INDUCED HYPERGLYCEMIA:  Her glucose was noted to be elevated with the use of steroids.  Glucose reached a peak of 274.  Her CBG was noted to be 88 on day of discharge,  therefore, no further insulin coverage will be required after discharge.  DISCHARGE MEDICATIONS: 1. Tussionex one teaspoon two times a day p.r.n. for cough. 2. Magic Mouthwash 5 cc swish and spit four times a day. 3. Combivent two puffs q.i.d. with spacer. 4. Prednisone on taper -- 10 mg tablets -- 30 mg a day for three days; 20 mg a    day for three days, 10 mg a day for three days and then stop.  ACTIVITY:  Activity is as tolerated.  DIET:  No concentrated sweets.  SPECIAL INSTRUCTIONS:  No smoking and no aspirin.  FOLLOWUP:  She has got a followup appointment with Dr. Danice Melton on December 21, 2001 at 3:30 p.m.  DISPOSITION/CONDITION ON DISCHARGE:  Her hemoptysis has resolved.  Chronic obstructive pulmonary disease is being addressed with appropriate bronchodilators.  Her steroid-induced hyperglycemia is improving with weaning of steroids.  She does have a history of hypercholesterolemia and has been on Pravachol in the past; this will be followed by Dr. Georgianne Melton at [facility]. Dictated by:   Earley Favor, RN, MSN, ACNP Attending Physician:  Jetty Duhamel Driver DD:  16/10/96 TD:  12/08/01 Job: [phone] [id number]

## 2010-11-30 NOTE — H&P (Signed)
[place]. [facility]  Patient:    [name], [name] Visit Number: [id number] MRN: [id number]          Service Type: MED Location: [phone] Attending Physician:  Jetty Duhamel Driver Dictated by:   Rennis Chris. Maple Hudson, M.D. Admit Date:  11/27/2001   CC:         Georgianne Fick, M.D.   History and Physical  ADMITTING DIAGNOSES: 1. Hemoptysis. 2. Chronic bronchitis. 3. Tobacco abuse. 4. Dislipidemia.  HISTORY OF PRESENT ILLNESS:  A 77 year old white female, one pack-per-day smoker, admitted via the emergency room after her second ER visit tonight with her third episode tonight of one cup hemoptysis.  She feels only a sense of choking at the blood comes up with no pain or localizing symptoms.  She has had no other bleeding, no lightheadedness, and no dyspnea or pain.  She had one other similar episode about a year ago in [place] where workup apparently included a bronchoscopy she understands shows only inflammation.  REVIEW OF SYSTEMS:  Weight stable.  No fever or sweat.  No adenopathy.  No bruising, epistaxis, melena, or other bleeding.  No palpitation, anginal or pleuritic chest pain.  Chronic cough, usually producing white or gray sputum. No dizziness, vertigo, confusion, or numbness.  A chronic right frontal headache/pressure discomfort which she says she is used to and does not try to treat.  Sleeps on one pillow with nocturia x1.  No edema, no calf pain.  No major arthralgias.  No change in bowel or bladder habit.  No breast or GYN problems except that she takes Premarin to prevent hot flashes.  PAST MEDICAL HISTORY:  Previous episode of hemoptysis one year ago.  One episode of pneumonia years ago.  Has had pneumococcal vaccine once.  Episodic bronchitis.  Remote history of duodenal ulcer.  No TB exposure.  No history of heart disease, cancer, diabetes, hypertension, liver or kidney problems, DVT, or pulmonary embolism.  Surgeries for right  carotid endarterectomy about 1998 by Dr. Arbie Cookey, and tonsillectomy.  Dislipidemia treated medically.  ALLERGIES:  PENICILLIN caused rash years ago.  IV CONTRAST DYE said to cause rash or hives.  MEDICATIONS: 1. Aspirin one daily. 2. Premarin. 3. Pravachol.  SOCIAL HISTORY:  One pack-per-day smoker.  No alcohol.  Widowed, lives alone. Retired [employer] production planner.  Has one daughter.  Does aerobics, lifts weights, walks a 15-minute mile three times a week.  FAMILY HISTORY:  Mother died at age 52 of old age.  Father died at age 13 of CVA.  Had two brothers - one has died of lung cancer; has three sisters prone to frequent colds.  Her daughter has hypertension and diabetes.  PHYSICAL EXAMINATION:  GENERAL:  Alert, well-developed, well-nourished white female.  VITAL SIGNS:  Temperature 98.5, blood pressure 174/75, pulse 80 - sinus by monitor, respirations about 20.  Saturation 96% on room air; on another check it was 92%.  SKIN:  No rash or bruising.  ADENOPATHY:  No found.  HEENT:  [place].  PERRLA.  Conjunctivae normal.  Speech clear.  Oral mucosa normal.  She is alert and oriented x4.  There is a right carotid endarterectomy scar.  No bruit, no JVD, no stridor or thyromegaly.  CHEST:  Breathing initially unlabored with some diffuse inspiratory and expiratory wheeze and mild rhonchi.  Mildly congested cough during quiet conversation with no rub or dullness.  Subsequently, she was noted to have episodes of hard coughing.  Emesis basin has at least a cup  of red blood.  BREASTS/GYNECOLOGICAL/RECTAL:  Not examined.  HEART:  Regular rhythm, normal S1, S2.  No murmur or gallop.  ABDOMEN:  No enlargement of liver or spleen.  No tenderness.  EXTREMITIES:  No cyanosis, clubbing, or edema.  No telangiectases.  No tremor.  LABORATORY DATA:  Chest x-ray:  Normal heart size.  Increased bronchovascular markings, especially in the left lower zone.  No adenopathy, effusion, mass, or  obvious localizing signs.  Impression is consistent with COPD/bronchitis.  All other lab work pending.  IMPRESSION: 1. Hemoptysis, relatively large volume, likely benign hemorrhagic bronchitis.    She needs CT and bronchoscopy.  She needs reemphasis of smoking cessation    and reconsideration of her chronic aspirin.  Differential diagnosis    includes cancer, arteriovenous malformation, pulmonary hypertension. 2. Chronic obstructive pulmonary disease, severity unclear and will need    pulmonary function testing eventually. 3. Dislipidemia, based on therapy with Pravachol.  PLAN:  She is being admitted now for stabilization, anticipated bronchoscopy and CT scan later today.  I will be out of town and care will be assumed by [facility] pulmonary.Dictated by:   Rennis Chris. Maple Hudson, M.D. Attending Physician:  Jetty Duhamel Driver DD:  16/10/96 TD:  11/28/01 Job: [id number]

## 2010-11-30 NOTE — Op Note (Signed)
[facility]  Patient:    [name], BOSLER                        MRN: [id number] Proc. Date: 11/20/00 Adm. Date:  [id number] Disc. Date: [id number] Attending:  Gustavo Lah A                           Operative Report  PREOPERATIVE DIAGNOSIS:  A 3.7 cm complex right lower ala laceration.  POSTOPERATIVE DIAGNOSIS:  A 3.7 cm complex right lower ala laceration.  PROCEDURE:  Debridement and repair of 3.7 cm complex right lower ala laceration.  ATTENDING SURGEON:  Mary A. Contogiannis, M.D.  ANESTHESIA:  Marcaine 0.5%.  COMPLICATIONS:  None.  INDICATIONS FOR PROCEDURE:  The patient is a 77 year old Caucasian female, who was out in her yard earlier today and accidentally fell against a lead pipe. As a result, she has the right lower ala laceration.  She requests that I proceed with repair at this time.  DESCRIPTION OF PROCEDURE:  The patient was lying supine on the stretcher in the ER.  She right face was then prepped with Betadine and draped in a sterile fashion.  The skin and subcutaneous tissues were then injected with 0.5% Marcaine in the area of the laceration.  The patient notes that she thinks that has a skin reaction to lidocaine, so that is why I used the Marcaine instead.  After adequate hemostasis and anesthesia had taken affect, the procedure was begun.  All of the abraded and contused skin edges were then sharply debrided.  There was a partial laceration to the orbicularis oculi muscle medially, and this was repaired with 5-0 Monocryl interrupted sutures. Next, the skin edges were repaired using 6-0 Prolene interrupted and running baseball sutures as appropriate.  The incision was then dressed with Maxitrol ophthalmic ointment.  There were no complications.  The patient tolerated the procedure well.  She was then discharged home in stable condition.  INSTRUCTIONS: 1. Clean the incision with water three times a day to keep it clean and free   of blood. 2. Apply Maxitrol ophthalmic ointment three times a day to the incision. 3. Take Keflex as directed. 4. Avoid stooping and bending of the head.  Elevate the head 30-45 degrees. 5. Apply an icepack to the right eye for the next 24-48 hours. 6. Call my office at [phone] to schedule a follow-up appointment for tomorrow    as well as if any problems develop. DD:  11/21/00 TD:  11/21/00 Job: [id number]

## 2010-11-30 NOTE — Consult Note (Signed)
[facility]  Patient:    [name], [name]                        MRN: [id number] Proc. Date: 11/20/00 Adm. Date:  [id number] Disc. Date: [id number] Attending:  Gustavo Lah A                          Consultation Report  HISTORY OF PRESENT ILLNESS:  The patient is a 77 year old Caucasian female who earlier today was working out in the yard and accidentally fell; as a result, she hit her right lower eyelid against a pipe.  She has a resulting laceration to the right lower eyelid that I am consulted for repair by Worthy Rancher, P.A.  PAST MEDICAL HISTORY:  History of carotid vascular disease.  Denies cardiac, lung, liver or kidney disease.  PAST SURGICAL HISTORY:  Status post right carotid endarterectomy as well as left rotator cuff surgery.  CURRENT MEDICATIONS:  Premarin and aspirin.  ALLERGIES:  LIDOCAINE was used to remove a mole in the past and caused a skin rash that resolved after treatment with what she thought was a steroid. PENICILLIN and CONTRAST DYE.  PHYSICAL EXAMINATION:  GENERAL:  The patient is a well-developed, well-nourished 77 year old Caucasian female in NAD.  HEENT:  [place].  PERRL.  EOMI.  No evidence of blurred vision, double vision or spots in the eyes.  Oropharynx without erythema.  A 3.7-cm complex laceration to the right lower eyelid that is V-shaped and has one limb that goes to within 2 to 3 mm of the lash line and extends medially beyond the punctum and laterally across the midline of the lower eyelid, with the other limb extending in a more vertical manner from the medial aspect with a gentle curve to the central portion of the eyelid.  The orbicularis oculi is partly divided along part of the medial aspect of this laceration.  IMPRESSION:  A 3.7-cm complex laceration of the right lower eyelid with abrasions and contusions.  At the patients request, I proceeded with repair of the laceration in the emergency  room.  DISCHARGE INSTRUCTIONS:  She was discharged home in stable condition with the following instructions: 1. Clean the incision with water three times a day to keep it free of blood    and clean. 2. Apply Maxitrol ophthalmic ointment to the incision t.i.d. 3. Take Keflex as directed. 4. Avoid any stooping or bending of the head.  Elevate your head of bed 30 to    45 degrees. 5. Apply ice to the right upper eye over the next 24 to 48 hours. 6. Call my office at [phone] to scheduled followup appointment for tomorrow    as well if any problems develop. DD:  11/21/00 TD:  11/21/00 Job: [id number]

## 2010-11-30 NOTE — Assessment & Plan Note (Signed)
[facility]                             PULMONARY OFFICE NOTE   SYD, MANGES                        MRN:          [id number]  DATE:10/09/2006                            DOB:          02-13-1924    This is a very pleasant 77 year old white female previously evaluated on  September 17, 2006 after not having been seen since January 2006.  The  patient presented at the time complaining of weight gain and dyspnea.  I  suspect that most of these issues are related to issues with obesity and  deconditioning.  We did perform ambulatory oximetry.  During that  evaluation the patient was noted to be hypertensive at the time.  Today  however she appears to be normotensive.  We did order a 2D echo which  showed that she had a normal left ventricular ejection fraction,  however, she had left ventricular wall thickness which was moderately  increased.  In addition she had mildly dilated left atrium and mild  mitral and aortic valvular regurgitation.  This is suspect for diastolic  dysfunction.  We did perform spirometry during her last visit which  showed her FEV1 to be at 101% of predicted, as well as her SVC at 110%  of predicted, consistent with prior PFTs.  She did have some decrease on  her mid-flows, however not enough to account for her degree of dyspnea.  We did give her a trial of Symbicort, however this made her hoarse and  she stopped taking it on her own.  The few days that she used it though  she did not notice any significant improvement.   The patient denies any issues with sleep as far as awakenings.  She has  not been told she snores.  She is aware of sleep apnea and she feels  that she does not have any issues with this.  She has have some  difficulties with insomnia, which has been a longstanding issue  particularly since her husband died and she relates this to that.   CURRENT MEDICATIONS:  As noted on the intake sheet.  These have been  reviewed and are accurate.   PHYSICAL EXAMINATION:  VITAL SIGNS:  Noted.  This patient is not noted  to be hypertensive today as she has been noted as such on past visits.  Oxygen saturation was 98% on room air.  GENERAL:  This is an obese white female who is in no acute distress.  HEENT:  Unremarkable for age.  No palpable adenopathy noted, no JVD.  LUNGS:  Clear to auscultation bilaterally.  She is moving air well.  CARDIAC:  Regular rate and rhythm, no rubs, murmurs, or gallops heard.  EXTREMITIES:  The patient has no cyanosis, no clubbing, no edema noted.   IMPRESSION:  1. Dyspnea, which I believe is multifactorial.  I am still concerned      about the possibility of diastolic dysfunction in this patient.      However, I believe that the major etiology of this is secondary to      obesity or obesity hypoventilation  syndrome.  2. Chronic obstructive pulmonary disease with well preserved FEV1.      The patient currently on no standing dose medications.  She has      tried numerous inhalers in the past and has been totally      dissatisfied with these and has discontinued them on her own and      really does not get any benefit from them   PLAN:  1. To send the patient to Dr. Nicholes Mango for full cardiopulmonary      assessment to determine whether the patient has indeed cardiac      pathology with exertion.  She has had a prior cardiopulmonary      stress test in January 2006 which at that time showed normal      exercise tolerance.  2. For her symptoms of insomnia will try her on Rozerem 8 mg at      bedtime.  The followup will be in 4-6 weeks time.  She is to      contact me prior to that time should she have any problems.     Gailen Shelter, MD  Electronically Signed    CLG/MedQ  DD: 10/09/2006  DT: 10/09/2006  Job #: [id number]   cc:   [name], MD

## 2010-11-30 NOTE — Procedures (Signed)
[facility]  Patient:    [name], [name] Visit Number: [id number] MRN: [id number]          Service Type: MED Location: [phone] Attending Physician:  Jetty Duhamel Driver Dictated by:   Danice Goltz, M.D. Proc. Date: 12/02/01 Admit Date:  11/27/2001   CC:         Clinton D. Maple Hudson, M.D.  D. Karle Plumber, M.D.   Procedure Report  DATE OF BIRTH:  05-01-1924  PROCEDURE PERFORMED: Bronchoscopy  PERFORMED BY:  Danice Goltz, M.D.  FIRST ASSISTANT:  Norton Blizzard, M.D.  INDICATION FOR PROCEDURE:  Evaluation of hemoptysis.  The patient on ventilatory support.  Mrs. Northrop is a 77 year old white female who presented on Nov 27, 2001 for evaluation of hemoptysis.  She has been found to have several recurrent episodes of this, approximately one episode per year.  She developed massive hemoptysis on Nov 27, 2001 in the evening.  Dr. Billy Fischer was called urgently.  At that time the patient required intubation.  She did have a bronchoscopy done at that time but nothing could be seen.  Dr. Karle Plumber also assisted with the procedure and evaluated the patient as well during that time.  The purpose of this bronchoscopy is follow up evaluation, now that the patient has had sufficient hemostasis to rule out potential endobronchial lesion or bronchiectasis.  DESCRIPTION OF PROCEDURE:  Procedure was carried out after appropriate consent was obtained from the patients family.  The patient was sedated and on ventilatory support at the time.  She was placed on 100% FIO2 and kept on ventilator support throughout the procedure.  The patient received an extra 200 mcg of Fentanyl during the procedure for sedation along with the sedation she already was receiving.  The Pentex fiberoptic bronchoscopy was then passed via Portex adaptor through the EG tube.  The EG tube could be seen approximately 3 to 4 cm above the carina.  The carina was  sharp; however, there was inflammation noted at the level of the trachea and carina. Bronchoscope was then passed through the right main stem bronchus.  The right upper lobe and right middle lobe subsegments appeared to have chronic bronchitic changes.  The right lower lobe subsegments, particularly the posterior right lower lobe had marked inflammation and appeared very edematous and hyperemic.  No arteriovenous malformations could be seen.  No endobronchial lesions were noted.  There were really no other abnormalities noted and nothing to suggest bronchiectasis.  At this point, the bronchoscope was then brought back to the carina.  Dr. Edwyna Shell then inspected the airway himself to evaluate this area, since this was an area of concern and to see if the patient actually required lobectomy.  Dr. Edwyna Shell concurred that only inflammatory changes could be seen.  He then made a quick inspection on the left and again, no endobronchial lesion could be seen.  The bronchoscope was then brought back to the carina and I took bronchoscope again to continue the procedure.  Left upper lobe was again with chronic bronchitic changes. Lingula was normal.  The left lower subsegments particularly middle and posterior subsegments did appear to have hyperemia, edema and inflammation as well.  Again no endobronchial lesions were noted.  At this point, the procedure was terminated.  The bronchoscope was retrieved. The patient tolerated the procedure well.  IMPRESSION:  Hemoptysis due to hemorrhagic bronchitis.  No endobronchial lesion noted.  PLAN:  Will be to start the patient on  a weaning protocol, once she is more alert and hopefully extubate in the next day or so.  Continue antibiotics, bronchodilators and IV corticosteroids for now.  Further plans pending patients response to the above. Dictated by:   Danice Goltz, M.D. Attending Physician:  Jetty Duhamel Driver DD:  18/84/16 TD:  12/07/01 Job:  [id number]

## 2010-11-30 NOTE — Procedures (Signed)
. [facility]  Patient:    [name], [name] Visit Number: [id number] MRN: [id number]          Service Type: MED Location: MICU [id number] Attending Physician:  Jetty Duhamel D Md Dictated by:   D. Karle Plumber, M.D. Admit Date:  11/27/2001   CC:         Oley Balm. Sung Amabile, M.D. [facility]                           Procedure Report  PREOPERATIVE DIAGNOSIS:  Massive hemoptysis with respiratory failure.  POSTOPERATIVE DIAGNOSIS:  Massive hemoptysis with respiratory failure.  OPERATION PERFORMED:  Fiberoptic bronchoscopy.  SURGEON:  D. Karle Plumber, M.D.  FIRST ASSISTANT:  Oley Balm. Sung Amabile, M.D.  ANESTHESIA:  INDICATION:  This patient had had three episodes of hemoptysis and it had stopped.  Previousbronchoscopy suggested that it was from the right lower lobe but you could not see any sites of active bleeding.  This afternoon, she started having more bleeding and developed severe tachypnea, was transferred to the intensive care unit and saturations continued to deteriorate down to 60%. Because of this and her coughing, she underwent intubation and was paralyzed with paralytics.  Her saturations still stayed at 70% and arterial line and a CVP line were inserted.  Chest x-ray showed total opacification of the right chest.  Bronchoscope was done with the laser bronchoscope and there was complete clot in the right main stem bronchus.  This was irrigated out with multiple irrigations and using both Foley balloons to break up the clot, brushes to break up the clot and biopsy forceps showed the clot was finally removed from the right middle lobe, right lower lobe and looking at the orifices, there appeared to be some mild clot in the right middle lobe and this was finally removed.  In the right lower lobe, in the basilar segments, there appeared to be one area of narrowing that couldpossibly have been an early tumor, we just could not be for sure.  The  clot was markedly adherent in the right upper lobe and this was finally dislodged using a Fogarty catheter to pull it out of the right upper lobe.After all the clot had been removed, there was no more bleeding and no evidence of any more active bleeding.  We thought that this again was fromthe right lower lobe but could not see a lesion to confirm that for sure.  The plan was initially to take her to the operating room for right middle and right lower lobe lobectomies but since we did not see a definite area of bleeding that had clotted and she had had so much bleeding, it was decided to keep her on the ventilator to allow her lungs to recover andto be prepared to repeat a bronchoscopy should she start bleeding again;this was explained in detail to her daughter. Dictated by:   D. Karle Plumber, M.D. Attending Physician:  Jetty Duhamel D Md DD:11/28/01 TD:  11/30/01 Job: [id number]

## 2011-09-12 ENCOUNTER — Ambulatory Visit: Payer: 59

## 2011-09-12 ENCOUNTER — Ambulatory Visit (INDEPENDENT_AMBULATORY_CARE_PROVIDER_SITE_OTHER): Payer: 59 | Admitting: Family Medicine

## 2011-09-12 VITALS — BP 122/72 | HR 67 | Temp 98.3°F | Resp 16 | Ht 61.75 in | Wt 175.6 lb

## 2011-09-12 DIAGNOSIS — M79609 Pain in unspecified limb: Secondary | ICD-10-CM | POA: Diagnosis not present

## 2011-09-12 DIAGNOSIS — M79603 Pain in arm, unspecified: Secondary | ICD-10-CM

## 2011-09-12 DIAGNOSIS — M25519 Pain in unspecified shoulder: Secondary | ICD-10-CM

## 2011-09-12 NOTE — Progress Notes (Addendum)
Patient Name: [name] Date of Birth: 1924-06-21 Medical Record Number: [id number] Gender: female Date of Encounter: 09/12/2011  History of Present Illness:  Min Tunnell is a 78 y.o. very pleasant female patient who presents with the following:  Has had right arm pain for about 2 weeks- then was worse with internal and external rotation.  This morning she brought her arm across her body and she heard " a lot of noise in her shoulder" and had increased pain.  She felt that her shoulder may have popped out of place and then gone back in.  This has not occurred any other time, and there is no history of injury.  Otherwise she feel well and is unhurt.  No history of osteoporosis or pathologic fractures.   No history of cancer.    Patient Active Problem List  Diagnoses  . HYPERLIPIDEMIA  . HYPERTENSION  . COPD, MILD  . HEMOPTYSIS UNSPECIFIED   Past Medical History  Diagnosis Date  . Pulmonary hemorrhage 2003  . Allergy   . Bronchospasm   . Shingles    History reviewed. No pertinent past surgical history. History  Substance Use Topics  . Smoking status: Former Games developer  . Smokeless tobacco: Never Used  . Alcohol Use: Not on file   History reviewed. No pertinent family history. Allergies  Allergen Reactions  . Asa Arthritis Strength-Antacid (Aspirin Buffered)   . Ivp Dye (Iodinated Diagnostic Agents)   . Penicillins     Medication list has been reviewed and updated.  Review of Systems: As per HPI- otherwise negative.  Physical Examination: Filed Vitals:   09/12/11 1447  BP: 122/72  Pulse: 67  Temp: 98.3 F (36.8 C)  TempSrc: Oral  Resp: 16  Height: 5' 1.75" (1.568 m)  Weight: 175 lb 9.6 oz (79.652 kg)    Body mass index is 32.38 kg/(m^2).  GEN: WDWN, NAD, Non-toxic, A & O x 3 HEENT: Atraumatic, Normocephalic. Neck supple. No masses, No LAD. Ears and Nose: No external deformity. CV: RRR, No M/G/R. No JVD. No thrill. No extra heart sounds. PULM: CTA B, no  wheezes, crackles, rhonchi. No retractions. No resp. distress. No accessory muscle use.Marland Kitchen EXTR: No c/c/e Patient indicated right deltoid as area of pain.  Shoulder certainly seems to be in joint, but she has some discomfort with extremes of ROM.   NEURO Normal gait.  PSYCH: Normally interactive. Conversant. Not depressed or anxious appearing.  Calm demeanor.   [facility] reading (PRIMARY) by  Dr. Patsy Lager.  Significant ACJ degeneration, but no fracture.  Her shoulder seemed that it may have subluxed with her internal and external rotation films- did another follow- up film to be sure shoulder was in place prior to D/C home and it did seem in position.   Assessment and Plan: 1. Arm pain  DG Humerus Right, DG Shoulder Right  2. Shoulder pain     I suspect Ms. Poulsen may have subluxed her shoulder this morning when she brought her arm across her body.  Placed in an arm sling and instructed not to rasie her arm overhead.   overread of shoulder film:  RIGHT SHOULDER - 2+ VIEW  Comparison: Numerous films same date, dictated separately.  Findings: Acromioclavicular joint and undersurface acromial degenerative change. The humeral head appears to project inferior to the central glenoid on the 2 internal rotation views. This is felt to be projectional. No evidence of dislocation on the external rotation view. Moderate glenohumeral osteoarthritis. Visualized portions of the right hemithorax are unremarkable.  IMPRESSION: Acromioclavicular and glenohumeral joint osteoarthritis. No acute osseous abnormality.  Apparent inferior position of the humeral head on initial views relative to the central glenoid. Favored to be projectional.  Called and discussed with Ms. Corvin when overread received.  We will refer her to ortho to have further evaluation of her shoulder.  She will wear her sling for comfort but may remove it as it practical, avoid overhead motions.

## 2011-09-12 NOTE — Progress Notes (Signed)
Addended by: Abbe Amsterdam C on: 09/12/2011 10:58 PM   Modules accepted: Orders

## 2011-09-18 ENCOUNTER — Telehealth: Payer: Self-pay

## 2011-09-20 ENCOUNTER — Telehealth: Payer: Self-pay

## 2011-09-20 NOTE — Telephone Encounter (Signed)
Pt notified, xray ready in pick up drawer.

## 2011-09-20 NOTE — Telephone Encounter (Signed)
Pt is requesting a copy of her recent xrays about her shoulder she said she had them done on Saturday   Please call patient when ready to pick up

## 2011-09-24 DIAGNOSIS — M542 Cervicalgia: Secondary | ICD-10-CM | POA: Diagnosis not present

## 2011-09-24 DIAGNOSIS — R209 Unspecified disturbances of skin sensation: Secondary | ICD-10-CM | POA: Diagnosis not present

## 2011-10-01 DIAGNOSIS — M25529 Pain in unspecified elbow: Secondary | ICD-10-CM | POA: Diagnosis not present

## 2011-10-31 NOTE — Telephone Encounter (Signed)
test

## 2011-11-01 DIAGNOSIS — M84369A Stress fracture, unspecified tibia and fibula, initial encounter for fracture: Secondary | ICD-10-CM | POA: Diagnosis not present

## 2011-11-14 DIAGNOSIS — J040 Acute laryngitis: Secondary | ICD-10-CM | POA: Diagnosis not present

## 2011-11-14 DIAGNOSIS — R05 Cough: Secondary | ICD-10-CM | POA: Diagnosis not present

## 2011-11-14 DIAGNOSIS — J069 Acute upper respiratory infection, unspecified: Secondary | ICD-10-CM | POA: Diagnosis not present

## 2011-11-18 DIAGNOSIS — H401490 Capsular glaucoma with pseudoexfoliation of lens, unspecified eye, stage unspecified: Secondary | ICD-10-CM | POA: Diagnosis not present

## 2011-11-18 DIAGNOSIS — H409 Unspecified glaucoma: Secondary | ICD-10-CM | POA: Diagnosis not present

## 2012-04-06 DIAGNOSIS — Z23 Encounter for immunization: Secondary | ICD-10-CM | POA: Diagnosis not present

## 2012-05-05 DIAGNOSIS — H524 Presbyopia: Secondary | ICD-10-CM | POA: Diagnosis not present

## 2012-05-05 DIAGNOSIS — H348192 Central retinal vein occlusion, unspecified eye, stable: Secondary | ICD-10-CM | POA: Diagnosis not present

## 2012-06-01 DIAGNOSIS — H401490 Capsular glaucoma with pseudoexfoliation of lens, unspecified eye, stage unspecified: Secondary | ICD-10-CM | POA: Diagnosis not present

## 2012-06-04 DIAGNOSIS — H348192 Central retinal vein occlusion, unspecified eye, stable: Secondary | ICD-10-CM | POA: Diagnosis not present

## 2012-06-04 DIAGNOSIS — H35379 Puckering of macula, unspecified eye: Secondary | ICD-10-CM | POA: Diagnosis not present

## 2012-06-04 DIAGNOSIS — Z961 Presence of intraocular lens: Secondary | ICD-10-CM | POA: Diagnosis not present

## 2012-06-04 DIAGNOSIS — H2510 Age-related nuclear cataract, unspecified eye: Secondary | ICD-10-CM | POA: Diagnosis not present

## 2012-06-04 DIAGNOSIS — H401490 Capsular glaucoma with pseudoexfoliation of lens, unspecified eye, stage unspecified: Secondary | ICD-10-CM | POA: Diagnosis not present

## 2012-07-13 DIAGNOSIS — H348192 Central retinal vein occlusion, unspecified eye, stable: Secondary | ICD-10-CM | POA: Diagnosis not present

## 2012-07-13 DIAGNOSIS — H2510 Age-related nuclear cataract, unspecified eye: Secondary | ICD-10-CM | POA: Diagnosis not present

## 2012-07-13 DIAGNOSIS — H409 Unspecified glaucoma: Secondary | ICD-10-CM | POA: Diagnosis not present

## 2012-07-13 DIAGNOSIS — H401490 Capsular glaucoma with pseudoexfoliation of lens, unspecified eye, stage unspecified: Secondary | ICD-10-CM | POA: Diagnosis not present

## 2012-12-09 DIAGNOSIS — H401490 Capsular glaucoma with pseudoexfoliation of lens, unspecified eye, stage unspecified: Secondary | ICD-10-CM | POA: Diagnosis not present

## 2012-12-09 DIAGNOSIS — H409 Unspecified glaucoma: Secondary | ICD-10-CM | POA: Diagnosis not present

## 2013-01-04 DIAGNOSIS — H35069 Retinal vasculitis, unspecified eye: Secondary | ICD-10-CM | POA: Diagnosis not present

## 2013-01-04 DIAGNOSIS — H348392 Tributary (branch) retinal vein occlusion, unspecified eye, stable: Secondary | ICD-10-CM | POA: Diagnosis not present

## 2013-01-04 DIAGNOSIS — H2510 Age-related nuclear cataract, unspecified eye: Secondary | ICD-10-CM | POA: Diagnosis not present

## 2013-01-04 DIAGNOSIS — H35379 Puckering of macula, unspecified eye: Secondary | ICD-10-CM | POA: Diagnosis not present

## 2013-01-08 DIAGNOSIS — M549 Dorsalgia, unspecified: Secondary | ICD-10-CM | POA: Diagnosis not present

## 2013-01-08 DIAGNOSIS — M542 Cervicalgia: Secondary | ICD-10-CM | POA: Diagnosis not present

## 2013-01-08 DIAGNOSIS — D6489 Other specified anemias: Secondary | ICD-10-CM | POA: Diagnosis not present

## 2013-01-08 DIAGNOSIS — M47812 Spondylosis without myelopathy or radiculopathy, cervical region: Secondary | ICD-10-CM | POA: Diagnosis not present

## 2013-01-08 DIAGNOSIS — I10 Essential (primary) hypertension: Secondary | ICD-10-CM | POA: Diagnosis not present

## 2013-01-08 DIAGNOSIS — I739 Peripheral vascular disease, unspecified: Secondary | ICD-10-CM | POA: Diagnosis not present

## 2013-01-08 DIAGNOSIS — E785 Hyperlipidemia, unspecified: Secondary | ICD-10-CM | POA: Diagnosis not present

## 2013-01-20 DIAGNOSIS — M47812 Spondylosis without myelopathy or radiculopathy, cervical region: Secondary | ICD-10-CM | POA: Diagnosis not present

## 2013-01-20 DIAGNOSIS — R05 Cough: Secondary | ICD-10-CM | POA: Diagnosis not present

## 2013-01-20 DIAGNOSIS — E785 Hyperlipidemia, unspecified: Secondary | ICD-10-CM | POA: Diagnosis not present

## 2013-01-20 DIAGNOSIS — I10 Essential (primary) hypertension: Secondary | ICD-10-CM | POA: Diagnosis not present

## 2013-01-20 DIAGNOSIS — D6489 Other specified anemias: Secondary | ICD-10-CM | POA: Diagnosis not present

## 2013-01-25 DIAGNOSIS — M542 Cervicalgia: Secondary | ICD-10-CM | POA: Diagnosis not present

## 2013-01-25 DIAGNOSIS — M2560 Stiffness of unspecified joint, not elsewhere classified: Secondary | ICD-10-CM | POA: Diagnosis not present

## 2013-01-25 DIAGNOSIS — M2540 Effusion, unspecified joint: Secondary | ICD-10-CM | POA: Diagnosis not present

## 2013-01-26 DIAGNOSIS — Z1212 Encounter for screening for malignant neoplasm of rectum: Secondary | ICD-10-CM | POA: Diagnosis not present

## 2013-01-29 DIAGNOSIS — M542 Cervicalgia: Secondary | ICD-10-CM | POA: Diagnosis not present

## 2013-01-29 DIAGNOSIS — M2540 Effusion, unspecified joint: Secondary | ICD-10-CM | POA: Diagnosis not present

## 2013-01-29 DIAGNOSIS — M2560 Stiffness of unspecified joint, not elsewhere classified: Secondary | ICD-10-CM | POA: Diagnosis not present

## 2013-02-01 DIAGNOSIS — M542 Cervicalgia: Secondary | ICD-10-CM | POA: Diagnosis not present

## 2013-02-01 DIAGNOSIS — M2560 Stiffness of unspecified joint, not elsewhere classified: Secondary | ICD-10-CM | POA: Diagnosis not present

## 2013-02-01 DIAGNOSIS — M2540 Effusion, unspecified joint: Secondary | ICD-10-CM | POA: Diagnosis not present

## 2013-02-08 DIAGNOSIS — M542 Cervicalgia: Secondary | ICD-10-CM | POA: Diagnosis not present

## 2013-02-08 DIAGNOSIS — M2560 Stiffness of unspecified joint, not elsewhere classified: Secondary | ICD-10-CM | POA: Diagnosis not present

## 2013-02-08 DIAGNOSIS — M2540 Effusion, unspecified joint: Secondary | ICD-10-CM | POA: Diagnosis not present

## 2013-02-09 DIAGNOSIS — D649 Anemia, unspecified: Secondary | ICD-10-CM | POA: Diagnosis not present

## 2013-02-12 DIAGNOSIS — M2540 Effusion, unspecified joint: Secondary | ICD-10-CM | POA: Diagnosis not present

## 2013-02-12 DIAGNOSIS — M542 Cervicalgia: Secondary | ICD-10-CM | POA: Diagnosis not present

## 2013-02-12 DIAGNOSIS — M2560 Stiffness of unspecified joint, not elsewhere classified: Secondary | ICD-10-CM | POA: Diagnosis not present

## 2013-02-15 DIAGNOSIS — M542 Cervicalgia: Secondary | ICD-10-CM | POA: Diagnosis not present

## 2013-02-15 DIAGNOSIS — M2560 Stiffness of unspecified joint, not elsewhere classified: Secondary | ICD-10-CM | POA: Diagnosis not present

## 2013-02-15 DIAGNOSIS — M2540 Effusion, unspecified joint: Secondary | ICD-10-CM | POA: Diagnosis not present

## 2013-02-17 DIAGNOSIS — D649 Anemia, unspecified: Secondary | ICD-10-CM | POA: Diagnosis not present

## 2013-05-01 DIAGNOSIS — Z23 Encounter for immunization: Secondary | ICD-10-CM | POA: Diagnosis not present

## 2013-05-21 DIAGNOSIS — H409 Unspecified glaucoma: Secondary | ICD-10-CM | POA: Diagnosis not present

## 2013-05-21 DIAGNOSIS — H401490 Capsular glaucoma with pseudoexfoliation of lens, unspecified eye, stage unspecified: Secondary | ICD-10-CM | POA: Diagnosis not present

## 2013-06-17 DIAGNOSIS — H401490 Capsular glaucoma with pseudoexfoliation of lens, unspecified eye, stage unspecified: Secondary | ICD-10-CM | POA: Diagnosis not present

## 2013-06-17 DIAGNOSIS — H353190 Nonexudative age-related macular degeneration, unspecified eye, stage unspecified: Secondary | ICD-10-CM | POA: Diagnosis not present

## 2013-06-17 DIAGNOSIS — H409 Unspecified glaucoma: Secondary | ICD-10-CM | POA: Diagnosis not present

## 2013-06-17 DIAGNOSIS — H2510 Age-related nuclear cataract, unspecified eye: Secondary | ICD-10-CM | POA: Diagnosis not present

## 2013-06-17 DIAGNOSIS — H348192 Central retinal vein occlusion, unspecified eye, stable: Secondary | ICD-10-CM | POA: Diagnosis not present

## 2013-06-17 DIAGNOSIS — Z961 Presence of intraocular lens: Secondary | ICD-10-CM | POA: Diagnosis not present

## 2013-06-17 DIAGNOSIS — H35379 Puckering of macula, unspecified eye: Secondary | ICD-10-CM | POA: Diagnosis not present

## 2013-06-28 DIAGNOSIS — H353190 Nonexudative age-related macular degeneration, unspecified eye, stage unspecified: Secondary | ICD-10-CM | POA: Diagnosis not present

## 2013-06-28 DIAGNOSIS — H409 Unspecified glaucoma: Secondary | ICD-10-CM | POA: Diagnosis not present

## 2013-06-28 DIAGNOSIS — H401490 Capsular glaucoma with pseudoexfoliation of lens, unspecified eye, stage unspecified: Secondary | ICD-10-CM | POA: Diagnosis not present

## 2013-07-19 DIAGNOSIS — IMO0002 Reserved for concepts with insufficient information to code with codable children: Secondary | ICD-10-CM | POA: Diagnosis not present

## 2013-07-19 DIAGNOSIS — M79609 Pain in unspecified limb: Secondary | ICD-10-CM | POA: Diagnosis not present

## 2013-08-18 DIAGNOSIS — H547 Unspecified visual loss: Secondary | ICD-10-CM | POA: Diagnosis not present

## 2013-08-18 DIAGNOSIS — I10 Essential (primary) hypertension: Secondary | ICD-10-CM | POA: Diagnosis not present

## 2013-08-18 DIAGNOSIS — R42 Dizziness and giddiness: Secondary | ICD-10-CM | POA: Diagnosis not present

## 2013-08-23 DIAGNOSIS — H409 Unspecified glaucoma: Secondary | ICD-10-CM | POA: Diagnosis not present

## 2013-08-23 DIAGNOSIS — H401490 Capsular glaucoma with pseudoexfoliation of lens, unspecified eye, stage unspecified: Secondary | ICD-10-CM | POA: Diagnosis not present

## 2013-08-23 DIAGNOSIS — H348192 Central retinal vein occlusion, unspecified eye, stable: Secondary | ICD-10-CM | POA: Diagnosis not present

## 2013-08-25 ENCOUNTER — Other Ambulatory Visit: Payer: Self-pay | Admitting: Internal Medicine

## 2013-08-25 DIAGNOSIS — R319 Hematuria, unspecified: Secondary | ICD-10-CM

## 2013-08-30 ENCOUNTER — Ambulatory Visit
Admission: RE | Admit: 2013-08-30 | Discharge: 2013-08-30 | Disposition: A | Discharge status: Home / Self Care | Payer: Medicare Other | Source: Ambulatory Visit | Attending: Internal Medicine | Admitting: Internal Medicine

## 2013-08-30 DIAGNOSIS — R319 Hematuria, unspecified: Secondary | ICD-10-CM | POA: Diagnosis not present

## 2013-09-24 ENCOUNTER — Ambulatory Visit (INDEPENDENT_AMBULATORY_CARE_PROVIDER_SITE_OTHER): Payer: Medicare Other | Admitting: Internal Medicine

## 2013-09-24 ENCOUNTER — Ambulatory Visit: Payer: Medicare Other

## 2013-09-24 VITALS — BP 128/80 | HR 72 | Temp 97.5°F | Resp 17 | Ht 62.0 in | Wt 173.0 lb

## 2013-09-24 DIAGNOSIS — D649 Anemia, unspecified: Secondary | ICD-10-CM

## 2013-09-24 DIAGNOSIS — J40 Bronchitis, not specified as acute or chronic: Secondary | ICD-10-CM | POA: Diagnosis not present

## 2013-09-24 DIAGNOSIS — J9801 Acute bronchospasm: Secondary | ICD-10-CM

## 2013-09-24 DIAGNOSIS — J029 Acute pharyngitis, unspecified: Secondary | ICD-10-CM

## 2013-09-24 LAB — POCT CBC
GRANULOCYTE PERCENT: 71.9 % (ref 37–80)
HEMATOCRIT: 34 % — AB (ref 37.7–47.9)
HEMOGLOBIN: 10.5 g/dL — AB (ref 12.2–16.2)
Lymph, poc: 1.8 (ref 0.6–3.4)
MCH, POC: 29.5 pg (ref 27–31.2)
MCHC: 30.9 g/dL — AB (ref 31.8–35.4)
MCV: 95.5 fL (ref 80–97)
MID (cbc): 0.8 (ref 0–0.9)
MPV: 7.1 fL (ref 0–99.8)
POC GRANULOCYTE: 6.5 (ref 2–6.9)
POC LYMPH PERCENT: 19.6 %L (ref 10–50)
POC MID %: 8.5 %M (ref 0–12)
Platelet Count, POC: 323 10*3/uL (ref 142–424)
RBC: 3.56 M/uL — AB (ref 4.04–5.48)
RDW, POC: 14.8 %
WBC: 9.1 10*3/uL (ref 4.6–10.2)

## 2013-09-24 MED ORDER — ALBUTEROL SULFATE (2.5 MG/3ML) 0.083% IN NEBU
2.5000 mg | INHALATION_SOLUTION | Freq: Once | RESPIRATORY_TRACT | Status: AC
  Administered 2013-09-24: 2.5 mg via RESPIRATORY_TRACT

## 2013-09-24 MED ORDER — AZITHROMYCIN 250 MG PO TABS: ORAL_TABLET | ORAL | Status: DC

## 2013-09-24 MED ORDER — ALBUTEROL SULFATE HFA 108 (90 BASE) MCG/ACT IN AERS
2.0000 | INHALATION_SPRAY | Freq: Four times a day (QID) | RESPIRATORY_TRACT | Status: DC | PRN
Start: 2013-09-24 — End: 2015-12-11

## 2013-09-24 MED ORDER — HYDROCODONE-ACETAMINOPHEN 7.5-325 MG/15ML PO SOLN: 5.0000 mL | Freq: Four times a day (QID) | ORAL | Status: DC | PRN

## 2013-09-24 MED ORDER — IPRATROPIUM BROMIDE 0.02 % IN SOLN
0.5000 mg | Freq: Once | RESPIRATORY_TRACT | Status: AC
  Administered 2013-09-24: 0.5 mg via RESPIRATORY_TRACT

## 2013-09-24 NOTE — Progress Notes (Signed)
   Subjective:    Patient ID: [name], female    DOB: 12-Mar-1924, 80 y.o.   MRN: [id number]  HPI 80 year old female complains of sore throat for 4 days. She has been gargling with salt water for the past 3 days which hasn't helped. No cough or any other symptoms. She has had a history of wheezing and quit smoking 15 years ago because she had a lung hemorrhage She is coughing and wheezing with productive sputum. She will miss aerobics today due to illness. No sob, cp.     Review of Systems     Objective:   Physical Exam  Constitutional: She is oriented to person, place, and time. She appears well-developed and well-nourished. No distress.  HENT:  Head: Normocephalic.  Right Ear: External ear normal.  Left Ear: External ear normal.  Nose: Mucosal edema and rhinorrhea present.  Mouth/Throat: Uvula is midline. No uvula swelling. Posterior oropharyngeal erythema present.  Eyes: EOM are normal.  Neck: Normal range of motion. Neck supple.  Cardiovascular: Normal rate.   Pulmonary/Chest: Effort normal. Not tachypneic. No respiratory distress. She has wheezes. She has rhonchi.  Loud rhonchi wheezes all fields.  Past CTs and CXRs reviewed. Hx of lung hemorrage and surgery past.  Musculoskeletal: Normal range of motion.  Lymphadenopathy:    She has cervical adenopathy.  Neurological: She is alert and oriented to person, place, and time. She exhibits normal muscle tone. Coordination normal.  Skin: No rash noted.  Psychiatric: She has a normal mood and affect. Her behavior is normal. Judgment and thought content normal.    [facility] reading (PRIMARY) by  Dr.Guest scarring, negative  Results for orders placed in visit on 09/24/13  POCT CBC      Result Value Ref Range   WBC 9.1  4.6 - 10.2 K/uL   Lymph, poc 1.8  0.6 - 3.4   POC LYMPH PERCENT 19.6  10 - 50 %L   MID (cbc) 0.8  0 - 0.9   POC MID % 8.5  0 - 12 %M   POC Granulocyte 6.5  2 - 6.9   Granulocyte percent 71.9  37 - 80 %G   RBC 3.56 (*) 4.04 - 5.48 M/uL   Hemoglobin 10.5 (*) 12.2 - 16.2 g/dL   HCT, POC 34.0 (*) 37.7 - 47.9 %   MCV 95.5  80 - 97 fL   MCH, POC 29.5  27 - 31.2 pg   MCHC 30.9 (*) 31.8 - 35.4 g/dL   RDW, POC 14.8     Platelet Count, POC 323  142 - 424 K/uL   MPV 7.1  0 - 99.8 fL   Neb--much improvement      Assessment & Plan:  Asthmatic bronchitis Anemia--see your FP Zithromax/Lortab

## 2013-09-24 NOTE — Patient Instructions (Signed)
Chronic Asthmatic Bronchitis °Chronic asthmatic bronchitis is a complication of persistent asthma. After a period of time with asthma, some people develop airflow obstruction that is present all the time, even when not having an asthma attack. There is also persistent inflammation of the airways, and the bronchial tubes produce more mucus. Chronic asthmatic bronchitis usually is a permanent problem with the lungs. °CAUSES  °Chronic asthmatic bronchitis happens most often in people who have asthma and also smoke cigarettes. Occasionally, it can happen to a person with long-standing or severe asthma even if the person is not a smoker. °SIGNS AND SYMPTOMS  °Chronic asthmatic bronchitis usually causes symptoms of both asthma and chronic bronchitis, including:  °· Coughing. °· Increased sputum production. °· Wheezing and shortness of breath. °· Chest discomfort. °· Recurring infections. °DIAGNOSIS  °Your health care provider will take a medical history and perform a physical exam. Chronic asthmatic bronchitis is suspected when a person with asthma has abnormal results on breathing tests (pulmonary function tests) even when breathing symptoms are at their best. Other tests, such as a chest X-ray, may be performed to rule out other conditions.  °TREATMENT  °Treatment involves controlling symptoms with medicine and lifestyle changes. °· Your health care provider may prescribe asthma medicines, including inhaler and nebulizer medicines. °· Infection can be treated with medicine to kill germs (antibiotics). Serious infections may require hospitalization. These can include: °· Pneumonia. °· Sinus infections. °· Acute bronchitis.   °· Preventing infection and hospitalization is very important. Get an influenza vaccination every year as directed by your health care provider. Ask your health care provider whether you need a pneumonia vaccine. °· Ask your health care provider whether you would benefit from a pulmonary  rehabilitation program. °HOME CARE INSTRUCTIONS °· Only take over-the-counter or prescription medicine as directed by your health care provider. °· If you are a cigarette smoker, the most important thing that you can do is quit. Talk to your health care provider for help with quitting smoking. °· Avoid pollen, dust, animal dander, molds, smoke, and other things that cause attacks. °· Regular exercise is very important to help you feel better. Discuss possible exercise routines with your health care provider. °· If animal dander is the cause of asthma, you may not be able to keep pets. °· It is important that you: °· Become educated about your medical condition. °· Participate in maintaining wellness. °· Seek medical care as directed. Delay in seeking medical care could cause permanent injury and may be a risk to your life. °SEEK MEDICAL CARE IF: °· You have wheezing and shortness of breath even if taking medicine to prevent attacks. °· You have muscle aches, chest pain, or thickening of sputum. °· Your sputum changes from clear or white to yellow, green, gray, or bloody. °SEEK IMMEDIATE MEDICAL CARE IF: °· Your usual medicines do not stop your wheezing. °· You have increased coughing or shortness of breath or both. °· You have increased difficulty breathing. °· You have any problems from the medicine you are taking, such as a rash, itching, swelling, or trouble breathing. °MAKE SURE YOU:  °· Understand these instructions. °· Will watch your condition. °· Will get help right away if you are not doing well or get worse. °Document Released: 04/18/2006 Document Revised: 04/21/2013 Document Reviewed: 01/28/2013 °[employer]® Patient Information ©2014 [employer]. ° °

## 2013-09-28 DIAGNOSIS — J45909 Unspecified asthma, uncomplicated: Secondary | ICD-10-CM | POA: Diagnosis not present

## 2013-09-28 DIAGNOSIS — I10 Essential (primary) hypertension: Secondary | ICD-10-CM | POA: Diagnosis not present

## 2013-09-28 DIAGNOSIS — Z0000 Encounter for general adult medical examination without abnormal findings: Secondary | ICD-10-CM | POA: Diagnosis not present

## 2013-10-06 DIAGNOSIS — Z23 Encounter for immunization: Secondary | ICD-10-CM | POA: Diagnosis not present

## 2013-10-06 DIAGNOSIS — I10 Essential (primary) hypertension: Secondary | ICD-10-CM | POA: Diagnosis not present

## 2013-10-06 DIAGNOSIS — I6529 Occlusion and stenosis of unspecified carotid artery: Secondary | ICD-10-CM | POA: Diagnosis not present

## 2013-10-06 DIAGNOSIS — R319 Hematuria, unspecified: Secondary | ICD-10-CM | POA: Diagnosis not present

## 2013-10-06 DIAGNOSIS — E782 Mixed hyperlipidemia: Secondary | ICD-10-CM | POA: Diagnosis not present

## 2013-10-06 DIAGNOSIS — J449 Chronic obstructive pulmonary disease, unspecified: Secondary | ICD-10-CM | POA: Diagnosis not present

## 2013-10-20 DIAGNOSIS — H348192 Central retinal vein occlusion, unspecified eye, stable: Secondary | ICD-10-CM | POA: Diagnosis not present

## 2013-10-20 DIAGNOSIS — Z961 Presence of intraocular lens: Secondary | ICD-10-CM | POA: Diagnosis not present

## 2013-10-20 DIAGNOSIS — H401490 Capsular glaucoma with pseudoexfoliation of lens, unspecified eye, stage unspecified: Secondary | ICD-10-CM | POA: Diagnosis not present

## 2013-10-20 DIAGNOSIS — H2510 Age-related nuclear cataract, unspecified eye: Secondary | ICD-10-CM | POA: Diagnosis not present

## 2013-10-20 DIAGNOSIS — H409 Unspecified glaucoma: Secondary | ICD-10-CM | POA: Diagnosis not present

## 2013-11-23 DIAGNOSIS — R490 Dysphonia: Secondary | ICD-10-CM | POA: Diagnosis not present

## 2013-11-23 DIAGNOSIS — H6120 Impacted cerumen, unspecified ear: Secondary | ICD-10-CM | POA: Diagnosis not present

## 2013-12-28 DIAGNOSIS — R3129 Other microscopic hematuria: Secondary | ICD-10-CM | POA: Diagnosis not present

## 2014-01-10 DIAGNOSIS — R310 Gross hematuria: Secondary | ICD-10-CM | POA: Diagnosis not present

## 2014-01-12 DIAGNOSIS — R310 Gross hematuria: Secondary | ICD-10-CM | POA: Diagnosis not present

## 2014-01-13 DIAGNOSIS — R310 Gross hematuria: Secondary | ICD-10-CM | POA: Diagnosis not present

## 2014-01-25 DIAGNOSIS — D4959 Neoplasm of unspecified behavior of other genitourinary organ: Secondary | ICD-10-CM | POA: Diagnosis not present

## 2014-01-25 DIAGNOSIS — R310 Gross hematuria: Secondary | ICD-10-CM | POA: Diagnosis not present

## 2014-01-27 DIAGNOSIS — Z862 Personal history of diseases of the blood and blood-forming organs and certain disorders involving the immune mechanism: Secondary | ICD-10-CM | POA: Diagnosis not present

## 2014-01-27 DIAGNOSIS — D649 Anemia, unspecified: Secondary | ICD-10-CM | POA: Diagnosis not present

## 2014-01-27 DIAGNOSIS — I10 Essential (primary) hypertension: Secondary | ICD-10-CM | POA: Diagnosis not present

## 2014-01-27 DIAGNOSIS — R110 Nausea: Secondary | ICD-10-CM | POA: Diagnosis not present

## 2014-01-27 DIAGNOSIS — M47812 Spondylosis without myelopathy or radiculopathy, cervical region: Secondary | ICD-10-CM | POA: Diagnosis not present

## 2014-01-27 DIAGNOSIS — E785 Hyperlipidemia, unspecified: Secondary | ICD-10-CM | POA: Diagnosis not present

## 2014-01-28 DIAGNOSIS — R319 Hematuria, unspecified: Secondary | ICD-10-CM | POA: Diagnosis not present

## 2014-01-28 DIAGNOSIS — R310 Gross hematuria: Secondary | ICD-10-CM | POA: Diagnosis not present

## 2014-01-28 DIAGNOSIS — D4959 Neoplasm of unspecified behavior of other genitourinary organ: Secondary | ICD-10-CM | POA: Diagnosis not present

## 2014-01-28 DIAGNOSIS — R110 Nausea: Secondary | ICD-10-CM | POA: Diagnosis not present

## 2014-02-04 DIAGNOSIS — R110 Nausea: Secondary | ICD-10-CM | POA: Diagnosis not present

## 2014-02-04 DIAGNOSIS — R7989 Other specified abnormal findings of blood chemistry: Secondary | ICD-10-CM | POA: Diagnosis not present

## 2014-02-11 DIAGNOSIS — R7989 Other specified abnormal findings of blood chemistry: Secondary | ICD-10-CM | POA: Diagnosis not present

## 2014-02-14 DIAGNOSIS — R748 Abnormal levels of other serum enzymes: Secondary | ICD-10-CM | POA: Diagnosis not present

## 2014-02-14 DIAGNOSIS — N179 Acute kidney failure, unspecified: Secondary | ICD-10-CM | POA: Diagnosis not present

## 2014-02-14 DIAGNOSIS — Z87440 Personal history of urinary (tract) infections: Secondary | ICD-10-CM | POA: Diagnosis not present

## 2014-02-14 DIAGNOSIS — I10 Essential (primary) hypertension: Secondary | ICD-10-CM | POA: Diagnosis not present

## 2014-03-02 DIAGNOSIS — I10 Essential (primary) hypertension: Secondary | ICD-10-CM | POA: Diagnosis not present

## 2014-03-02 DIAGNOSIS — N289 Disorder of kidney and ureter, unspecified: Secondary | ICD-10-CM | POA: Diagnosis not present

## 2014-03-02 DIAGNOSIS — D4959 Neoplasm of unspecified behavior of other genitourinary organ: Secondary | ICD-10-CM | POA: Diagnosis not present

## 2014-03-02 DIAGNOSIS — C659 Malignant neoplasm of unspecified renal pelvis: Secondary | ICD-10-CM | POA: Diagnosis not present

## 2014-03-02 DIAGNOSIS — R319 Hematuria, unspecified: Secondary | ICD-10-CM | POA: Diagnosis not present

## 2014-03-02 DIAGNOSIS — E785 Hyperlipidemia, unspecified: Secondary | ICD-10-CM | POA: Diagnosis not present

## 2014-03-02 DIAGNOSIS — R310 Gross hematuria: Secondary | ICD-10-CM | POA: Diagnosis not present

## 2014-03-07 DIAGNOSIS — Z01818 Encounter for other preprocedural examination: Secondary | ICD-10-CM | POA: Diagnosis not present

## 2014-03-07 DIAGNOSIS — C659 Malignant neoplasm of unspecified renal pelvis: Secondary | ICD-10-CM | POA: Diagnosis not present

## 2014-03-07 DIAGNOSIS — D4959 Neoplasm of unspecified behavior of other genitourinary organ: Secondary | ICD-10-CM | POA: Diagnosis not present

## 2014-03-09 DIAGNOSIS — H409 Unspecified glaucoma: Secondary | ICD-10-CM | POA: Diagnosis not present

## 2014-03-09 DIAGNOSIS — H2510 Age-related nuclear cataract, unspecified eye: Secondary | ICD-10-CM | POA: Diagnosis not present

## 2014-03-09 DIAGNOSIS — H4011X0 Primary open-angle glaucoma, stage unspecified: Secondary | ICD-10-CM | POA: Diagnosis not present

## 2014-03-16 DIAGNOSIS — C659 Malignant neoplasm of unspecified renal pelvis: Secondary | ICD-10-CM | POA: Diagnosis not present

## 2014-03-16 DIAGNOSIS — Z23 Encounter for immunization: Secondary | ICD-10-CM | POA: Diagnosis not present

## 2014-03-16 DIAGNOSIS — C801 Malignant (primary) neoplasm, unspecified: Secondary | ICD-10-CM | POA: Diagnosis not present

## 2014-03-16 DIAGNOSIS — I10 Essential (primary) hypertension: Secondary | ICD-10-CM | POA: Diagnosis not present

## 2014-03-16 DIAGNOSIS — R748 Abnormal levels of other serum enzymes: Secondary | ICD-10-CM | POA: Diagnosis not present

## 2014-03-23 DIAGNOSIS — N183 Chronic kidney disease, stage 3 unspecified: Secondary | ICD-10-CM | POA: Diagnosis not present

## 2014-03-23 DIAGNOSIS — I10 Essential (primary) hypertension: Secondary | ICD-10-CM | POA: Diagnosis not present

## 2014-03-23 DIAGNOSIS — C801 Malignant (primary) neoplasm, unspecified: Secondary | ICD-10-CM | POA: Diagnosis not present

## 2014-03-23 DIAGNOSIS — R748 Abnormal levels of other serum enzymes: Secondary | ICD-10-CM | POA: Diagnosis not present

## 2014-04-07 DIAGNOSIS — I10 Essential (primary) hypertension: Secondary | ICD-10-CM | POA: Diagnosis not present

## 2014-04-07 DIAGNOSIS — I739 Peripheral vascular disease, unspecified: Secondary | ICD-10-CM | POA: Diagnosis not present

## 2014-04-07 DIAGNOSIS — Z466 Encounter for fitting and adjustment of urinary device: Secondary | ICD-10-CM | POA: Diagnosis not present

## 2014-04-07 DIAGNOSIS — C659 Malignant neoplasm of unspecified renal pelvis: Secondary | ICD-10-CM | POA: Diagnosis not present

## 2014-04-07 DIAGNOSIS — D4959 Neoplasm of unspecified behavior of other genitourinary organ: Secondary | ICD-10-CM | POA: Diagnosis not present

## 2014-04-13 DIAGNOSIS — C659 Malignant neoplasm of unspecified renal pelvis: Secondary | ICD-10-CM | POA: Diagnosis not present

## 2014-04-13 DIAGNOSIS — D4959 Neoplasm of unspecified behavior of other genitourinary organ: Secondary | ICD-10-CM | POA: Diagnosis not present

## 2014-05-11 DIAGNOSIS — E782 Mixed hyperlipidemia: Secondary | ICD-10-CM | POA: Diagnosis not present

## 2014-05-11 DIAGNOSIS — R319 Hematuria, unspecified: Secondary | ICD-10-CM | POA: Diagnosis not present

## 2014-05-11 DIAGNOSIS — C641 Malignant neoplasm of right kidney, except renal pelvis: Secondary | ICD-10-CM | POA: Diagnosis not present

## 2014-05-11 DIAGNOSIS — I10 Essential (primary) hypertension: Secondary | ICD-10-CM | POA: Diagnosis not present

## 2014-05-17 DIAGNOSIS — B372 Candidiasis of skin and nail: Secondary | ICD-10-CM | POA: Diagnosis not present

## 2014-05-18 DIAGNOSIS — H34811 Central retinal vein occlusion, right eye: Secondary | ICD-10-CM | POA: Diagnosis not present

## 2014-05-18 DIAGNOSIS — H401421 Capsular glaucoma with pseudoexfoliation of lens, left eye, mild stage: Secondary | ICD-10-CM | POA: Diagnosis not present

## 2014-05-18 DIAGNOSIS — H2511 Age-related nuclear cataract, right eye: Secondary | ICD-10-CM | POA: Diagnosis not present

## 2014-05-18 DIAGNOSIS — Z961 Presence of intraocular lens: Secondary | ICD-10-CM | POA: Diagnosis not present

## 2014-06-06 DIAGNOSIS — H401421 Capsular glaucoma with pseudoexfoliation of lens, left eye, mild stage: Secondary | ICD-10-CM | POA: Diagnosis not present

## 2014-06-06 DIAGNOSIS — H401414 Capsular glaucoma with pseudoexfoliation of lens, right eye, indeterminate stage: Secondary | ICD-10-CM | POA: Diagnosis not present

## 2014-06-23 DIAGNOSIS — H35372 Puckering of macula, left eye: Secondary | ICD-10-CM | POA: Diagnosis not present

## 2014-06-23 DIAGNOSIS — H2511 Age-related nuclear cataract, right eye: Secondary | ICD-10-CM | POA: Diagnosis not present

## 2014-06-23 DIAGNOSIS — H3531 Nonexudative age-related macular degeneration: Secondary | ICD-10-CM | POA: Diagnosis not present

## 2014-06-23 DIAGNOSIS — H34811 Central retinal vein occlusion, right eye: Secondary | ICD-10-CM | POA: Diagnosis not present

## 2014-06-23 DIAGNOSIS — H401490 Capsular glaucoma with pseudoexfoliation of lens, unspecified eye, stage unspecified: Secondary | ICD-10-CM | POA: Diagnosis not present

## 2014-06-23 DIAGNOSIS — Z961 Presence of intraocular lens: Secondary | ICD-10-CM | POA: Diagnosis not present

## 2014-07-02 DIAGNOSIS — R05 Cough: Secondary | ICD-10-CM | POA: Diagnosis not present

## 2014-07-02 DIAGNOSIS — J40 Bronchitis, not specified as acute or chronic: Secondary | ICD-10-CM | POA: Diagnosis not present

## 2014-07-02 DIAGNOSIS — J329 Chronic sinusitis, unspecified: Secondary | ICD-10-CM | POA: Diagnosis not present

## 2014-07-27 DIAGNOSIS — E782 Mixed hyperlipidemia: Secondary | ICD-10-CM | POA: Diagnosis not present

## 2014-07-27 DIAGNOSIS — I10 Essential (primary) hypertension: Secondary | ICD-10-CM | POA: Diagnosis not present

## 2014-07-27 DIAGNOSIS — D649 Anemia, unspecified: Secondary | ICD-10-CM | POA: Diagnosis not present

## 2014-07-27 DIAGNOSIS — R319 Hematuria, unspecified: Secondary | ICD-10-CM | POA: Diagnosis not present

## 2014-08-08 DIAGNOSIS — I10 Essential (primary) hypertension: Secondary | ICD-10-CM | POA: Diagnosis not present

## 2014-08-08 DIAGNOSIS — E782 Mixed hyperlipidemia: Secondary | ICD-10-CM | POA: Diagnosis not present

## 2014-08-08 DIAGNOSIS — R319 Hematuria, unspecified: Secondary | ICD-10-CM | POA: Diagnosis not present

## 2014-08-08 DIAGNOSIS — C641 Malignant neoplasm of right kidney, except renal pelvis: Secondary | ICD-10-CM | POA: Diagnosis not present

## 2014-10-26 DIAGNOSIS — E782 Mixed hyperlipidemia: Secondary | ICD-10-CM | POA: Diagnosis not present

## 2014-10-26 DIAGNOSIS — I10 Essential (primary) hypertension: Secondary | ICD-10-CM | POA: Diagnosis not present

## 2014-10-26 DIAGNOSIS — C641 Malignant neoplasm of right kidney, except renal pelvis: Secondary | ICD-10-CM | POA: Diagnosis not present

## 2014-11-07 DIAGNOSIS — E782 Mixed hyperlipidemia: Secondary | ICD-10-CM | POA: Diagnosis not present

## 2014-11-07 DIAGNOSIS — C641 Malignant neoplasm of right kidney, except renal pelvis: Secondary | ICD-10-CM | POA: Diagnosis not present

## 2014-11-07 DIAGNOSIS — J40 Bronchitis, not specified as acute or chronic: Secondary | ICD-10-CM | POA: Diagnosis not present

## 2014-11-07 DIAGNOSIS — I10 Essential (primary) hypertension: Secondary | ICD-10-CM | POA: Diagnosis not present

## 2014-12-03 DIAGNOSIS — R9431 Abnormal electrocardiogram [ECG] [EKG]: Secondary | ICD-10-CM | POA: Diagnosis not present

## 2014-12-03 DIAGNOSIS — R002 Palpitations: Secondary | ICD-10-CM | POA: Diagnosis not present

## 2014-12-03 DIAGNOSIS — R0602 Shortness of breath: Secondary | ICD-10-CM | POA: Diagnosis not present

## 2014-12-03 DIAGNOSIS — E782 Mixed hyperlipidemia: Secondary | ICD-10-CM | POA: Diagnosis not present

## 2014-12-04 ENCOUNTER — Encounter (HOSPITAL_COMMUNITY): Payer: Self-pay | Admitting: Emergency Medicine

## 2014-12-04 ENCOUNTER — Inpatient Hospital Stay (HOSPITAL_COMMUNITY)
Admission: EM | Admit: 2014-12-04 | Discharge: 2014-12-06 | DRG: 812 | Disposition: A | Discharge status: Home / Self Care | Payer: Medicare Other | Attending: Internal Medicine | Admitting: Internal Medicine

## 2014-12-04 ENCOUNTER — Emergency Department (HOSPITAL_COMMUNITY): Payer: Medicare Other

## 2014-12-04 DIAGNOSIS — Z87891 Personal history of nicotine dependence: Secondary | ICD-10-CM

## 2014-12-04 DIAGNOSIS — R110 Nausea: Secondary | ICD-10-CM | POA: Diagnosis not present

## 2014-12-04 DIAGNOSIS — E669 Obesity, unspecified: Secondary | ICD-10-CM | POA: Diagnosis present

## 2014-12-04 DIAGNOSIS — N189 Chronic kidney disease, unspecified: Secondary | ICD-10-CM | POA: Diagnosis present

## 2014-12-04 DIAGNOSIS — Z886 Allergy status to analgesic agent status: Secondary | ICD-10-CM

## 2014-12-04 DIAGNOSIS — E860 Dehydration: Secondary | ICD-10-CM | POA: Diagnosis present

## 2014-12-04 DIAGNOSIS — D649 Anemia, unspecified: Secondary | ICD-10-CM | POA: Diagnosis not present

## 2014-12-04 DIAGNOSIS — I129 Hypertensive chronic kidney disease with stage 1 through stage 4 chronic kidney disease, or unspecified chronic kidney disease: Secondary | ICD-10-CM | POA: Diagnosis present

## 2014-12-04 DIAGNOSIS — D6489 Other specified anemias: Secondary | ICD-10-CM | POA: Diagnosis not present

## 2014-12-04 DIAGNOSIS — D62 Acute posthemorrhagic anemia: Principal | ICD-10-CM | POA: Diagnosis present

## 2014-12-04 DIAGNOSIS — J449 Chronic obstructive pulmonary disease, unspecified: Secondary | ICD-10-CM | POA: Diagnosis present

## 2014-12-04 DIAGNOSIS — I10 Essential (primary) hypertension: Secondary | ICD-10-CM | POA: Diagnosis not present

## 2014-12-04 DIAGNOSIS — Z6832 Body mass index (BMI) 32.0-32.9, adult: Secondary | ICD-10-CM | POA: Diagnosis not present

## 2014-12-04 DIAGNOSIS — E785 Hyperlipidemia, unspecified: Secondary | ICD-10-CM | POA: Diagnosis not present

## 2014-12-04 DIAGNOSIS — Z7982 Long term (current) use of aspirin: Secondary | ICD-10-CM

## 2014-12-04 DIAGNOSIS — R112 Nausea with vomiting, unspecified: Secondary | ICD-10-CM | POA: Diagnosis not present

## 2014-12-04 DIAGNOSIS — N179 Acute kidney failure, unspecified: Secondary | ICD-10-CM | POA: Diagnosis present

## 2014-12-04 DIAGNOSIS — N2889 Other specified disorders of kidney and ureter: Secondary | ICD-10-CM | POA: Diagnosis not present

## 2014-12-04 DIAGNOSIS — Z880 Allergy status to penicillin: Secondary | ICD-10-CM | POA: Diagnosis not present

## 2014-12-04 DIAGNOSIS — R0602 Shortness of breath: Secondary | ICD-10-CM | POA: Diagnosis not present

## 2014-12-04 HISTORY — DX: Essential (primary) hypertension: I10

## 2014-12-04 HISTORY — DX: Hyperlipidemia, unspecified: E78.5

## 2014-12-04 LAB — I-STAT CG4 LACTIC ACID, ED: Lactic Acid, Venous: 1.6 mmol/L (ref 0.5–2.0)

## 2014-12-04 LAB — BASIC METABOLIC PANEL
Anion gap: 9 (ref 5–15)
BUN: 34 mg/dL — AB (ref 6–20)
CALCIUM: 8.5 mg/dL — AB (ref 8.9–10.3)
CHLORIDE: 101 mmol/L (ref 101–111)
CO2: 23 mmol/L (ref 22–32)
Creatinine, Ser: 1.29 mg/dL — ABNORMAL HIGH (ref 0.44–1.00)
GFR calc Af Amer: 42 mL/min — ABNORMAL LOW (ref >60)
GFR calc non Af Amer: 36 mL/min — ABNORMAL LOW (ref >60)
Glucose, Bld: 131 mg/dL — ABNORMAL HIGH (ref 65–99)
POTASSIUM: 4.1 mmol/L (ref 3.5–5.1)
Sodium: 133 mmol/L — ABNORMAL LOW (ref 135–145)

## 2014-12-04 LAB — LIPASE, BLOOD: LIPASE: 55 U/L — AB (ref 22–51)

## 2014-12-04 LAB — I-STAT TROPONIN, ED: Troponin i, poc: 0.01 ng/mL (ref 0.00–0.08)

## 2014-12-04 LAB — HEPATIC FUNCTION PANEL
ALK PHOS: 64 U/L (ref 38–126)
ALT: 14 U/L (ref 14–54)
AST: 17 U/L (ref 15–41)
Albumin: 3 g/dL — ABNORMAL LOW (ref 3.5–5.0)
Total Bilirubin: 0.5 mg/dL (ref 0.3–1.2)
Total Protein: 6.3 g/dL — ABNORMAL LOW (ref 6.5–8.1)

## 2014-12-04 LAB — PREPARE RBC (CROSSMATCH)

## 2014-12-04 LAB — BRAIN NATRIURETIC PEPTIDE: B NATRIURETIC PEPTIDE 5: 119.7 pg/mL — AB (ref 0.0–100.0)

## 2014-12-04 MED ORDER — ONDANSETRON HCL 4 MG/2ML IJ SOLN
4.0000 mg | Freq: Once | INTRAMUSCULAR | Status: AC
  Administered 2014-12-04: 4 mg via INTRAVENOUS
  Filled 2014-12-04: qty 2

## 2014-12-04 MED ORDER — SODIUM CHLORIDE 0.9 % IV SOLN: 10.0000 mL/h | Freq: Once | INTRAVENOUS | Status: DC

## 2014-12-04 MED ORDER — ONDANSETRON HCL 4 MG/2ML IJ SOLN: 4.0000 mg | Freq: Three times a day (TID) | INTRAMUSCULAR | Status: DC | PRN

## 2014-12-04 MED ORDER — SODIUM CHLORIDE 0.9 % IV SOLN: INTRAVENOUS | Status: DC

## 2014-12-04 MED ORDER — ONDANSETRON 4 MG PO TBDP
4.0000 mg | ORAL_TABLET | Freq: Once | ORAL | Status: AC
  Administered 2014-12-04: 4 mg via ORAL
  Filled 2014-12-04: qty 1

## 2014-12-04 MED ORDER — SODIUM CHLORIDE 0.9 % IV SOLN
INTRAVENOUS | Status: DC
  Administered 2014-12-04: 21:00:00 via INTRAVENOUS

## 2014-12-04 NOTE — ED Notes (Signed)
Hold off of occult blood per Dr. Vanita Panda, pt denies blood in stool or emesis.

## 2014-12-04 NOTE — Progress Notes (Signed)
Report taken from ED RN Janett Billow. Room ready for admission.

## 2014-12-04 NOTE — ED Notes (Signed)
C/o nausea, vomiting, and shortness of breath with exertion since Thursday.  States she was seen at [facility] yesterday for same and told "something was wrong with heart."

## 2014-12-04 NOTE — ED Provider Notes (Signed)
CSN: [id number]     Arrival date & time 12/04/14  1949 History   First MD Initiated Contact with Patient 12/04/14 2030     Chief Complaint  Patient presents with  . Nausea  . Emesis  . Shortness of Breath     (Consider location/radiation/quality/duration/timing/severity/associated sxs/prior Treatment) HPI Patient presents with concern nausea, vomiting, dyspnea with exertion. Symptoms began 3 days ago, initially with nausea, but soon thereafter with the dyspnea on exertion. Patient has no chest pain, either with episodes of vomiting, or with exertion. No pleuritic pain, no cough, no fever, no chills. At rest the patient has minimal symptoms. She was well prior to the onset of symptoms. No clear alleviating or exacerbating factors. There is associated anorexia, weakness. Patient has a history of renal malignancy, now status post ablation, and has known chronic kidney disease.   Past Medical History  Diagnosis Date  . Pulmonary hemorrhage 2003  . Allergy   . Bronchospasm   . Shingles    History reviewed. No pertinent past surgical history. No family history on file. History  Substance Use Topics  . Smoking status: Former Research scientist (life sciences)  . Smokeless tobacco: Never Used  . Alcohol Use: No   OB History    No data available     Review of Systems  Constitutional:       Per HPI, otherwise negative  HENT:       Per HPI, otherwise negative  Respiratory:       Per HPI, otherwise negative  Cardiovascular:       Per HPI, otherwise negative  Gastrointestinal: Positive for nausea and vomiting.  Endocrine:       Negative aside from HPI  Genitourinary:       Neg aside from HPI   Musculoskeletal:       Per HPI, otherwise negative  Skin: Negative.   Neurological: Negative for syncope.      Allergies  Asa arthritis strength-antacid; Ivp dye; and Penicillins  Home Medications   Prior to Admission medications   Medication Sig Start Date End Date Taking? Authorizing Provider   acetaminophen (TYLENOL) 325 MG tablet Take 650 mg by mouth every 6 (six) hours as needed (pain).   Yes Historical Provider, MD  albuterol (PROVENTIL HFA;VENTOLIN HFA) 108 (90 BASE) MCG/ACT inhaler Inhale 2 puffs into the lungs every 6 (six) hours as needed for wheezing or shortness of breath. 09/24/13  Yes Orma Flaming, MD  aspirin EC 325 MG tablet Take 650 mg by mouth every 6 (six) hours as needed (pain).   Yes Historical Provider, MD  atorvastatin (LIPITOR) 20 MG tablet Take 20 mg by mouth at bedtime.    Yes Historical Provider, MD  irbesartan (AVAPRO) 300 MG tablet Take 300 mg by mouth at bedtime.   Yes Historical Provider, MD  timolol (TIMOPTIC) 0.5 % ophthalmic solution Place 1 drop into both eyes 2 (two) times daily.  11/21/14  Yes Historical Provider, MD  tretinoin (RETIN-A) 0.1 % cream Apply 1 application topically at bedtime as needed (skin care).    Yes Historical Provider, MD   BP 139/54 mmHg  Pulse 114  Temp(Src) 98.1 F (36.7 C) (Oral)  Resp 20  Ht 5\' 1"  (1.549 m)  Wt 170 lb (77.111 kg)  BMI 32.14 kg/m2  SpO2 96% Physical Exam  Constitutional: She is oriented to person, place, and time. She appears well-developed and well-nourished. No distress.  HENT:  Head: Normocephalic and atraumatic.  Eyes: Conjunctivae and EOM are normal.  Cardiovascular:  Regular rhythm.  Tachycardia present.   Pulmonary/Chest: Effort normal and breath sounds normal. No stridor. No respiratory distress.  Abdominal: She exhibits no distension.  Musculoskeletal: She exhibits no edema.  Neurological: She is alert and oriented to person, place, and time. No cranial nerve deficit.  Skin: Skin is warm and dry.  Psychiatric: She has a normal mood and affect.  Nursing note and vitals reviewed.   ED Course  Procedures (including critical care time) Labs Review Labs Reviewed  BASIC METABOLIC PANEL - Abnormal; Notable for the following:    Sodium 133 (*)    Glucose, Bld 131 (*)    BUN 34 (*)     Creatinine, Ser 1.29 (*)    Calcium 8.5 (*)    GFR calc non Af Amer 36 (*)    GFR calc Af Amer 42 (*)    All other components within normal limits  CBC  BRAIN NATRIURETIC PEPTIDE  HEPATIC FUNCTION PANEL  LIPASE, BLOOD  URINALYSIS, ROUTINE W REFLEX MICROSCOPIC  D-DIMER, QUANTITATIVE  I-STAT TROPOININ, ED  I-STAT CG4 LACTIC ACID, ED    Imaging Review Dg Chest 2 View  12/04/2014   CLINICAL DATA:  Patient with shortness of breath and dizziness. Nausea for 4 days.  EXAM: CHEST  2 VIEW  COMPARISON:  Chest radiograph 09/24/2013  FINDINGS: Stable cardiac and mediastinal contours with tortuosity of the thoracic aorta. No consolidative pulmonary opacities. No pleural effusion or pneumothorax. Mid thoracic spine degenerative changes.  IMPRESSION: No acute cardiopulmonary process.   Electronically Signed   By: Lovey Newcomer M.D.   On: 12/04/2014 20:48     EKG Interpretation   Date/Time:  Sunday Dec 04 2014 20:00:50 EDT Ventricular Rate:  103 PR Interval:  176 QRS Duration: 70 QT Interval:  340 QTC Calculation: 445 R Axis:   24 Text Interpretation:  Sinus tachycardia Nonspecific ST abnormality  Abnormal ECG Sinus tachycardia Artifact Abnormal ekg Confirmed by  Carmin Muskrat  MD [phone]) on 12/04/2014 8:33:50 PM     Chart review demonstrates the patient had distant hospitalization for hemoptysis, massive. Patient no longer smokes. On repeat exam after initial labs demonstrate anemia, patient states that she has had previously diagnosed iron deficiency anemia, has not been taking iron supplements in some time. She reiterates, no changes in bowel movements, no black stool, red stool, no vomiting, no hematuria. Patient is amenable to blood transfusion.  Patient refused rectal exam MDM  Patient presents with ongoing nausea, and new dyspnea with exertion. Here the patient is awake, alert, appropriately interactive, and vital signs stable at rest. There is mild tachycardia, consistent with  anemia. Given the patient's acknowledgment of chronic anemia, her statement that she has not been taking iron supplements recently, she was provided blood transfusion or Patient tolerated initial transfusion well, was admitted for further evaluation, management.  CRITICAL CARE Performed by: Carmin Muskrat Total critical care time: 35 Critical care time was exclusive of separately billable procedures and treating other patients. Critical care was necessary to treat or prevent imminent or life-threatening deterioration. Critical care was time spent personally by me on the following activities: development of treatment plan with patient and/or surrogate as well as nursing, discussions with consultants, evaluation of patient's response to treatment, examination of patient, obtaining history from patient or surrogate, ordering and performing treatments and interventions, ordering and review of laboratory studies, ordering and review of radiographic studies, pulse oximetry and re-evaluation of patient's condition.   Carmin Muskrat, MD 12/04/14 [phone]

## 2014-12-05 ENCOUNTER — Encounter (HOSPITAL_COMMUNITY): Payer: Self-pay | Admitting: Internal Medicine

## 2014-12-05 ENCOUNTER — Inpatient Hospital Stay (HOSPITAL_COMMUNITY): Payer: Medicare Other

## 2014-12-05 DIAGNOSIS — N189 Chronic kidney disease, unspecified: Secondary | ICD-10-CM | POA: Diagnosis present

## 2014-12-05 DIAGNOSIS — Z87891 Personal history of nicotine dependence: Secondary | ICD-10-CM | POA: Diagnosis not present

## 2014-12-05 DIAGNOSIS — I10 Essential (primary) hypertension: Secondary | ICD-10-CM

## 2014-12-05 DIAGNOSIS — Z886 Allergy status to analgesic agent status: Secondary | ICD-10-CM | POA: Diagnosis not present

## 2014-12-05 DIAGNOSIS — E860 Dehydration: Secondary | ICD-10-CM | POA: Diagnosis present

## 2014-12-05 DIAGNOSIS — D62 Acute posthemorrhagic anemia: Secondary | ICD-10-CM | POA: Diagnosis not present

## 2014-12-05 DIAGNOSIS — Z880 Allergy status to penicillin: Secondary | ICD-10-CM | POA: Diagnosis not present

## 2014-12-05 DIAGNOSIS — N179 Acute kidney failure, unspecified: Secondary | ICD-10-CM | POA: Diagnosis present

## 2014-12-05 DIAGNOSIS — E785 Hyperlipidemia, unspecified: Secondary | ICD-10-CM | POA: Diagnosis present

## 2014-12-05 DIAGNOSIS — D649 Anemia, unspecified: Secondary | ICD-10-CM | POA: Diagnosis not present

## 2014-12-05 DIAGNOSIS — I129 Hypertensive chronic kidney disease with stage 1 through stage 4 chronic kidney disease, or unspecified chronic kidney disease: Secondary | ICD-10-CM | POA: Diagnosis present

## 2014-12-05 DIAGNOSIS — R110 Nausea: Secondary | ICD-10-CM | POA: Diagnosis not present

## 2014-12-05 DIAGNOSIS — E669 Obesity, unspecified: Secondary | ICD-10-CM | POA: Diagnosis present

## 2014-12-05 DIAGNOSIS — Z7982 Long term (current) use of aspirin: Secondary | ICD-10-CM | POA: Diagnosis not present

## 2014-12-05 DIAGNOSIS — Z6832 Body mass index (BMI) 32.0-32.9, adult: Secondary | ICD-10-CM | POA: Diagnosis not present

## 2014-12-05 DIAGNOSIS — J449 Chronic obstructive pulmonary disease, unspecified: Secondary | ICD-10-CM | POA: Diagnosis present

## 2014-12-05 DIAGNOSIS — R112 Nausea with vomiting, unspecified: Secondary | ICD-10-CM | POA: Diagnosis present

## 2014-12-05 DIAGNOSIS — N2889 Other specified disorders of kidney and ureter: Secondary | ICD-10-CM | POA: Diagnosis not present

## 2014-12-05 LAB — CBC
HCT: 18.3 % — ABNORMAL LOW (ref 36.0–46.0)
Hemoglobin: 5.8 g/dL — CL (ref 12.0–15.0)
MCH: 29.4 pg (ref 26.0–34.0)
MCHC: 31.7 g/dL (ref 30.0–36.0)
MCV: 92.9 fL (ref 78.0–100.0)
PLATELETS: 264 10*3/uL (ref 150–400)
RBC: 1.97 MIL/uL — AB (ref 3.87–5.11)
RDW: 17.8 % — ABNORMAL HIGH (ref 11.5–15.5)
WBC: 8.1 10*3/uL (ref 4.0–10.5)

## 2014-12-05 LAB — CBC WITH DIFFERENTIAL/PLATELET
Basophils Absolute: 0 10*3/uL (ref 0.0–0.1)
Basophils Relative: 0 % (ref 0–1)
Eosinophils Absolute: 0.4 10*3/uL (ref 0.0–0.7)
Eosinophils Relative: 6 % — ABNORMAL HIGH (ref 0–5)
HCT: 24 % — ABNORMAL LOW (ref 36.0–46.0)
HEMOGLOBIN: 8 g/dL — AB (ref 12.0–15.0)
LYMPHS ABS: 0.9 10*3/uL (ref 0.7–4.0)
LYMPHS PCT: 14 % (ref 12–46)
MCH: 30 pg (ref 26.0–34.0)
MCHC: 33.3 g/dL (ref 30.0–36.0)
MCV: 89.9 fL (ref 78.0–100.0)
MONO ABS: 0.8 10*3/uL (ref 0.1–1.0)
MONOS PCT: 12 % (ref 3–12)
NEUTROS PCT: 68 % (ref 43–77)
Neutro Abs: 4.5 10*3/uL (ref 1.7–7.7)
Platelets: 189 10*3/uL (ref 150–400)
RBC: 2.67 MIL/uL — AB (ref 3.87–5.11)
RDW: 15.9 % — ABNORMAL HIGH (ref 11.5–15.5)
WBC: 6.7 10*3/uL (ref 4.0–10.5)

## 2014-12-05 LAB — LACTATE DEHYDROGENASE: LDH: 201 U/L — AB (ref 98–192)

## 2014-12-05 LAB — LIPID PANEL
CHOLESTEROL: 121 mg/dL (ref 0–200)
HDL: 36 mg/dL — ABNORMAL LOW (ref >40)
LDL CALC: 67 mg/dL (ref 0–99)
Total CHOL/HDL Ratio: 3.4 RATIO
Triglycerides: 88 mg/dL (ref <150)
VLDL: 18 mg/dL (ref 0–40)

## 2014-12-05 LAB — IRON AND TIBC
Iron: 38 ug/dL (ref 28–170)
SATURATION RATIOS: 13 % (ref 10.4–31.8)
TIBC: 302 ug/dL (ref 250–450)
UIBC: 264 ug/dL

## 2014-12-05 LAB — COMPREHENSIVE METABOLIC PANEL
ALK PHOS: 50 U/L (ref 38–126)
ALT: 12 U/L — ABNORMAL LOW (ref 14–54)
AST: 17 U/L (ref 15–41)
Albumin: 2.7 g/dL — ABNORMAL LOW (ref 3.5–5.0)
Anion gap: 8 (ref 5–15)
BUN: 24 mg/dL — AB (ref 6–20)
CO2: 22 mmol/L (ref 22–32)
Calcium: 7.7 mg/dL — ABNORMAL LOW (ref 8.9–10.3)
Chloride: 106 mmol/L (ref 101–111)
Creatinine, Ser: 1.3 mg/dL — ABNORMAL HIGH (ref 0.44–1.00)
GFR calc Af Amer: 41 mL/min — ABNORMAL LOW (ref >60)
GFR calc non Af Amer: 36 mL/min — ABNORMAL LOW (ref >60)
Glucose, Bld: 106 mg/dL — ABNORMAL HIGH (ref 65–99)
Potassium: 4.2 mmol/L (ref 3.5–5.1)
Sodium: 136 mmol/L (ref 135–145)
TOTAL PROTEIN: 4.7 g/dL — AB (ref 6.5–8.1)
Total Bilirubin: 0.7 mg/dL (ref 0.3–1.2)

## 2014-12-05 LAB — RETICULOCYTES
RBC.: 2.73 MIL/uL — ABNORMAL LOW (ref 3.87–5.11)
Retic Count, Absolute: 158.3 10*3/uL (ref 19.0–186.0)
Retic Ct Pct: 5.8 % — ABNORMAL HIGH (ref 0.4–3.1)

## 2014-12-05 LAB — TROPONIN I: Troponin I: <0.03 ng/mL (ref <0.031)

## 2014-12-05 LAB — VITAMIN B12: Vitamin B-12: 382 pg/mL (ref 180–914)

## 2014-12-05 LAB — FOLATE: FOLATE: 32.6 ng/mL (ref >5.9)

## 2014-12-05 LAB — LIPASE, BLOOD: LIPASE: 48 U/L (ref 22–51)

## 2014-12-05 LAB — FERRITIN: FERRITIN: 13 ng/mL (ref 11–307)

## 2014-12-05 LAB — ABO/RH: ABO/RH(D): B POS

## 2014-12-05 MED ORDER — ACETAMINOPHEN 325 MG PO TABS
650.0000 mg | ORAL_TABLET | Freq: Four times a day (QID) | ORAL | Status: DC | PRN
Start: 2014-12-05 — End: 2014-12-06

## 2014-12-05 MED ORDER — BARIUM SULFATE 2.1 % PO SUSP
900.0000 mL | Freq: Once | ORAL | Status: AC
  Administered 2014-12-05: 900 mL via ORAL

## 2014-12-05 MED ORDER — PANTOPRAZOLE SODIUM 40 MG IV SOLR
40.0000 mg | INTRAVENOUS | Status: DC
  Administered 2014-12-05: 40 mg via INTRAVENOUS
  Filled 2014-12-05 (×2): qty 40

## 2014-12-05 MED ORDER — ALBUTEROL SULFATE (2.5 MG/3ML) 0.083% IN NEBU: 2.5000 mg | INHALATION_SOLUTION | Freq: Four times a day (QID) | RESPIRATORY_TRACT | Status: DC | PRN

## 2014-12-05 MED ORDER — PANTOPRAZOLE SODIUM 40 MG PO TBEC
40.0000 mg | DELAYED_RELEASE_TABLET | Freq: Two times a day (BID) | ORAL | Status: DC
  Administered 2014-12-05 – 2014-12-06 (×2): 40 mg via ORAL
  Filled 2014-12-05 (×2): qty 1

## 2014-12-05 MED ORDER — SODIUM CHLORIDE 0.9 % IV SOLN: INTRAVENOUS | Status: DC

## 2014-12-05 MED ORDER — TRETINOIN 0.1 % EX CREA: 1.0000 "application " | TOPICAL_CREAM | Freq: Every evening | CUTANEOUS | Status: DC | PRN

## 2014-12-05 MED ORDER — ACETAMINOPHEN 650 MG RE SUPP: 650.0000 mg | Freq: Four times a day (QID) | RECTAL | Status: DC | PRN

## 2014-12-05 MED ORDER — ZOLPIDEM TARTRATE 5 MG PO TABS
5.0000 mg | ORAL_TABLET | Freq: Once | ORAL | Status: AC
  Administered 2014-12-05: 5 mg via ORAL
  Filled 2014-12-05: qty 1

## 2014-12-05 MED ORDER — IRBESARTAN 300 MG PO TABS
300.0000 mg | ORAL_TABLET | Freq: Every day | ORAL | Status: DC
  Administered 2014-12-05: 300 mg via ORAL
  Filled 2014-12-05 (×2): qty 1

## 2014-12-05 MED ORDER — ATORVASTATIN CALCIUM 20 MG PO TABS
20.0000 mg | ORAL_TABLET | Freq: Every day | ORAL | Status: DC
  Administered 2014-12-05 (×2): 20 mg via ORAL
  Filled 2014-12-05 (×3): qty 1

## 2014-12-05 MED ORDER — TIMOLOL MALEATE 0.5 % OP SOLN
1.0000 [drp] | Freq: Two times a day (BID) | OPHTHALMIC | Status: DC
  Administered 2014-12-05 – 2014-12-06 (×3): 1 [drp] via OPHTHALMIC
  Filled 2014-12-05 (×2): qty 5

## 2014-12-05 MED ORDER — ONDANSETRON HCL 4 MG PO TABS
4.0000 mg | ORAL_TABLET | Freq: Four times a day (QID) | ORAL | Status: DC | PRN
Start: 2014-12-05 — End: 2014-12-06

## 2014-12-05 MED ORDER — ONDANSETRON HCL 4 MG/2ML IJ SOLN: 4.0000 mg | Freq: Four times a day (QID) | INTRAMUSCULAR | Status: DC | PRN

## 2014-12-05 NOTE — Progress Notes (Signed)
Utilization review complete. Sindhu Nguyen RN CCM Case Mgmt phone [phone] 

## 2014-12-05 NOTE — Progress Notes (Signed)
New Admission Note:   Arrival Method: per stretcher from ED with RN Janett Billow and daughter Pamala Hurry Mental Orientation: alert, oriented X4, conversant Telemetry: placed on telebox 6E22 after CCMD notified Assessment: Completed Skin: warm, dry, intact, both heels were dry and flaky, no wounds noted IV: G20 on the right hand with transparent dressing, clean, dry and intact Pain: denies having any pain as of this time Tubes: IV line with blood transfusion going on Safety Measures: Safety Fall Prevention Plan has been given, discussed and signed Admission: Completed [place] Orientation: Patient has been orientated to the room, unit and staff.  Family: daughter Pamala Hurry at bedside  Orders have been reviewed and implemented. Will continue to monitor the patient. Call light has been placed within reach and bed alarm has been activated.   Georgeanna Harrison BSN, RN [place] [address] [place]

## 2014-12-05 NOTE — H&P (Addendum)
[facility] History and Physical  [name] ZOX:[id number] DOB: 11/27/1923 DOA: 12/04/2014  Referring physician: Dr.Lockwood. PCP: No primary care provider on file. Dr.Ramachandran. Specialists: None.  Chief Complaint: Nausea and feeling weak.  HPI: [name] is a 81 y.o. female with history of hypertension, hyperlipidemia and previous history of hemoptysis presents to the ER because of nausea and feeling weak. Patient states over the last 4 days patient has been feeling nauseated and was unable to eat anything. She did throw up once. Denies any blood in the vomitus. Denies any blood in the stools. In addition patient has been feeling weak and fatigued over the last 4 days. Denies taking any NSAIDs. In the ER patient's hemoglobin is found to be around 5.8 and patient is refusing to have rectal exam done. Patient's CBC is normocytic picture. Labs revealed mildly elevated lipase. Patient has been admitted for further management. Patient denies any chest pain shortness of breath fever chills.   Review of Systems: As presented in the history of presenting illness, rest negative.  Past Medical History  Diagnosis Date  . Pulmonary hemorrhage 2003  . Allergy   . Bronchospasm   . Shingles   . Hypertension   . HLD (hyperlipidemia)    History reviewed. No pertinent past surgical history. Social History:  reports that she has quit smoking. She has never used smokeless tobacco. She reports that she does not drink alcohol or use illicit drugs. Where does patient live home. Can patient participate in ADLs? Yes.  Allergies  Allergen Reactions  . Asa Arthritis Strength-Antacid [Aspirin Buffered] Other (See Comments)    Pt does not recall this reaction  . Ivp Dye [Iodinated Diagnostic Agents] Rash  . Penicillins Rash    Family History:  Family History  Problem Relation Age of Onset  . Stroke Father   . Lung cancer Brother       Prior to Admission medications   Medication Sig  Start Date End Date Taking? Authorizing Provider  acetaminophen (TYLENOL) 325 MG tablet Take 650 mg by mouth every 6 (six) hours as needed (pain).   Yes Historical Provider, MD  albuterol (PROVENTIL HFA;VENTOLIN HFA) 108 (90 BASE) MCG/ACT inhaler Inhale 2 puffs into the lungs every 6 (six) hours as needed for wheezing or shortness of breath. 09/24/13  Yes Orma Flaming, MD  aspirin EC 325 MG tablet Take 650 mg by mouth every 6 (six) hours as needed (pain).   Yes Historical Provider, MD  atorvastatin (LIPITOR) 20 MG tablet Take 20 mg by mouth at bedtime.    Yes Historical Provider, MD  irbesartan (AVAPRO) 300 MG tablet Take 300 mg by mouth at bedtime.   Yes Historical Provider, MD  timolol (TIMOPTIC) 0.5 % ophthalmic solution Place 1 drop into both eyes 2 (two) times daily.  11/21/14  Yes Historical Provider, MD  tretinoin (RETIN-A) 0.1 % cream Apply 1 application topically at bedtime as needed (skin care).    Yes Historical Provider, MD    Physical Exam: Filed Vitals:   12/04/14 2315 12/04/14 2317 12/04/14 2330 12/04/14 2346  BP: 106/47 106/47 125/50 118/48  Pulse: 97 99 99 96  Temp:  99 F (37.2 C)  98.9 F (37.2 C)  TempSrc:  Oral    Resp: 22 23 24 21   Height:      Weight:      SpO2: 92% 97% 92% 95%     General:  Well-developed and nourished.  Eyes: Anicteric no pallor.  ENT: No discharge from the  ears eyes nose and mouth.  Neck: No mass felt.  Cardiovascular: S1-S2 heard.  Respiratory: No rhonchi or crepitations.  Abdomen: Soft nontender bowel sounds present.  Skin: No rash.  Musculoskeletal: No edema.  Psychiatric: Appears normal.  Neurologic: Alert awake oriented to time place and person. Moves all extremities.  Labs on Admission:  Basic Metabolic Panel:  Recent Labs Lab 12/04/14 2008  NA 133*  K 4.1  CL 101  CO2 23  GLUCOSE 131*  BUN 34*  CREATININE 1.29*  CALCIUM 8.5*   Liver Function Tests:  Recent Labs Lab 12/04/14 2008  AST 17  ALT 14   ALKPHOS 64  BILITOT 0.5  PROT 6.3*  ALBUMIN 3.0*    Recent Labs Lab 12/04/14 2008  LIPASE 55*   No results for input(s): AMMONIA in the last 168 hours. CBC:  Recent Labs Lab 12/04/14 2008  WBC 8.1  HGB 5.8*  HCT 18.3*  MCV 92.9  PLT 264   Cardiac Enzymes: No results for input(s): CKTOTAL, CKMB, CKMBINDEX, TROPONINI in the last 168 hours.  BNP (last 3 results)  Recent Labs  12/04/14 2008  BNP 119.7*    ProBNP (last 3 results) No results for input(s): PROBNP in the last 8760 hours.  CBG: No results for input(s): GLUCAP in the last 168 hours.  Radiological Exams on Admission: Dg Chest 2 View  12/04/2014   CLINICAL DATA:  Patient with shortness of breath and dizziness. Nausea for 4 days.  EXAM: CHEST  2 VIEW  COMPARISON:  Chest radiograph 09/24/2013  FINDINGS: Stable cardiac and mediastinal contours with tortuosity of the thoracic aorta. No consolidative pulmonary opacities. No pleural effusion or pneumothorax. Mid thoracic spine degenerative changes.  IMPRESSION: No acute cardiopulmonary process.   Electronically Signed   By: Lovey Newcomer M.D.   On: 12/04/2014 20:48    EKG: Independently reviewed. Sinus tachycardia.  Assessment/Plan Principal Problem:   Symptomatic anemia Active Problems:   Essential hypertension   Nausea without vomiting   Hyperlipidemia   1. Symptomatic anemia - normocytic normochromic picture. Patient is receiving 2 units of packed red blood cell transfusion which has been already ordered in the ER. Follow CBC after transfusion. Patient will need further workup for anemia. We'll need to check anemia panel LDH. If patient agrees for GI workup and then will need [facility] enterology consult. At this time patient is refusing rectal exam. I have ordered stool for occult blood whenever patient moves her bowels. Since patient also has nausea have ordered CT abdomen and pelvis with by mouth contrast. 2. Nausea with elevated lipase - I have ordered CT  abdomen and pelvis with by mouth contrast (avoiding IV contrast secondary to acute renal failure). Continue gentle hydration and I have placed patient on clear liquid diet. 3. Acute renal failure - if patient's creatinine does not improve with transfusion and hydration then may have to hold ARB's. Closely follow metabolic panel. 4. Hyperlipidemia on statins - check lipid panel specifically for triglycerides. 5. History of hemoptysis presently denies any hemoptysis.   DVT Prophylaxis SCDs.  Code Status: Full code.  Family Communication: Patient's daughter.  Disposition Plan: Admit to inpatient. Likely stay 2-3 days.    Darel Ricketts N. [facility] Pager [phone].  If 7PM-7AM, please contact night-coverage [email or web address] Password [facility] 12/05/2014, 12:29 AM

## 2014-12-05 NOTE — Progress Notes (Signed)
Patient ID: [name], female   DOB: [birth year], 81 y.o.   MRN: [id number]  [facility] PROGRESS NOTE  [place] CBU:[id number] DOB: 07/03/1924 DOA: 12/04/2014 PCP: No primary care provider on file.   Brief narrative:    81 y.o. female with history of hypertension, hyperlipidemia and previous history of hemoptysis presented to the [facility] ED with main concern of fatigue and weakness several days in duration with poor oral intake and with one episode of non bloody vomiting. Denies fevers, chills, sick contacts, no blood in urine or stool, no chest pain or shortness of breath, denies weight loss.   In ED, blood work notable for Hg 5.8 and 2 U PRBC requested for transfusion, pt has refused rectal exam, THR asked to admit for further evaluation.   Assessment/Plan:    Principal Problem:   Symptomatic acute blood loss anemia  - unclear etiology at this time - CT abd with gastric wall thickening and possible source of bleeding but that is now very clear at this time - with last known Hg in 2012 was ~12 - pt has refused rectal exam and is still refusing it - pt has received two units of PRBC and with appropriate increase in Hg post transfusion - pt wants to go home but has agreed to stay till am to have blood work done, does not want any interventions - spoke with GI team on call and outpatient follow up recommended - will schedule an appointment prior to discharge  Active Problems:   Essential hypertension - reasonable inpatient control  - hold avapro for now until renal function stabilizes    Nausea without vomiting - possibly related to principal problem outlined above - now resolved and pt wants to eat regular diet    Acute renal failure - possible pre renal in the setting of acute blood loss anemia - continue IVF and repeat BMP in AM   Obesity  - Body mass index is 32.88 kg/(m^2).   COPD with emphysema - respiratory status stable this AM - several punctate nodules noted on  CT chest - pt made aware and will follow up with her pulmonologist  - pt maintaining oxygen saturation at target range    Dense structures in both kidneys - ? hemorrhagic or proteinaceous cysts, however, indeterminate - incidental finding on CT   DVT prophylaxis - SCD's  Code Status: Full.  Family Communication:  plan of care discussed with the patient Disposition Plan: Home in AM if Hg stable   IV access:  Peripheral IV  Procedures and diagnostic studies:    Ct Abdomen Pelvis Wo Contrast  12/05/2014   There is concern for areas of gastric wall thickening, particularly in the gastric body. Findings raise concern for gastric inflammation and consider a GI consultation.  Dense structures in both kidneys could represent hemorrhagic or proteinaceous cysts but indeterminate.  Emphysematous changes at the lung bases. There are a few punctate nodular densities as described. If the patient is at high risk for bronchogenic carcinoma, follow-up chest CT at 1 year is recommended. If the patient is at low risk, no follow-up is needed.   Dg Chest 2 View  12/04/2014  No acute cardiopulmonary process.    Medical Consultants:  None  Other Consultants:  None  IAnti-Infectives:   None  Faye Ramsay, MD  [facility] Pager [phone]  If 7PM-7AM, please contact night-coverage [email or web address] Password [email or web address] 12/05/2014, 4:09 PM   LOS: 0 days   HPI/Subjective: No events overnight. Pt says she wants  to eat more.   Objective: Filed Vitals:   12/05/14 0315 12/05/14 0347 12/05/14 0620 12/05/14 0750  BP: 114/46 100/42 116/45 102/49  Pulse: 80 79 73 76  Temp: 98.1 F (36.7 C) 98.5 F (36.9 C) 98 F (36.7 C) 98.6 F (37 C)  TempSrc: Oral Oral Oral Oral  Resp: 19 20 16 16   Height:      Weight:   78.881 kg (173 lb 14.4 oz)   SpO2: 96% 95% 95% 96%    Intake/Output Summary (Last 24 hours) at 12/05/14 1609 Last data filed at 12/05/14 1500  Gross per 24 hour  Intake   2030 ml  Output    800 ml   Net   1230 ml    Exam:   General:  Pt is alert, follows commands appropriately, not in acute distress  Cardiovascular: Regular rate and rhythm, no rubs, no gallops  Respiratory: Clear to auscultation bilaterally, no wheezing, no crackles, no rhonchi  Abdomen: Soft, non tender, non distended, bowel sounds present, no guarding  Extremities: No edema, pulses DP and PT palpable bilaterally  Neuro: Grossly nonfocal  Data Reviewed: Basic Metabolic Panel:  Recent Labs Lab 12/04/14 2008 12/05/14 1030  NA 133* 136  K 4.1 4.2  CL 101 106  CO2 23 22  GLUCOSE 131* 106*  BUN 34* 24*  CREATININE 1.29* 1.30*  CALCIUM 8.5* 7.7*   Liver Function Tests:  Recent Labs Lab 12/04/14 2008 12/05/14 1030  AST 17 17  ALT 14 12*  ALKPHOS 64 50  BILITOT 0.5 0.7  PROT 6.3* 4.7*  ALBUMIN 3.0* 2.7*    Recent Labs Lab 12/04/14 2008 12/05/14 1030  LIPASE 55* 48   No results for input(s): AMMONIA in the last 168 hours. CBC:  Recent Labs Lab 12/04/14 2008 12/05/14 1030  WBC 8.1 6.7  NEUTROABS  --  4.5  HGB 5.8* 8.0*  HCT 18.3* 24.0*  MCV 92.9 89.9  PLT 264 189   Cardiac Enzymes:  Recent Labs Lab 12/05/14 1030  TROPONINI <0.03   Scheduled Meds: . atorvastatin  20 mg Oral QHS  . irbesartan  300 mg Oral QHS  . pantoprazole (PROTONIX) IV  40 mg Intravenous Q24H  . timolol  1 drop Both Eyes BID   Continuous Infusions: . sodium chloride Stopped (12/05/14 0336)

## 2014-12-06 DIAGNOSIS — D62 Acute posthemorrhagic anemia: Principal | ICD-10-CM

## 2014-12-06 LAB — TYPE AND SCREEN
ABO/RH(D): B POS
ANTIBODY SCREEN: NEGATIVE
UNIT DIVISION: 0
Unit division: 0

## 2014-12-06 LAB — BASIC METABOLIC PANEL
Anion gap: 7 (ref 5–15)
BUN: 21 mg/dL — ABNORMAL HIGH (ref 6–20)
CO2: 21 mmol/L — ABNORMAL LOW (ref 22–32)
CREATININE: 1.36 mg/dL — AB (ref 0.44–1.00)
Calcium: 8.1 mg/dL — ABNORMAL LOW (ref 8.9–10.3)
Chloride: 105 mmol/L (ref 101–111)
GFR calc Af Amer: 39 mL/min — ABNORMAL LOW (ref >60)
GFR calc non Af Amer: 34 mL/min — ABNORMAL LOW (ref >60)
Glucose, Bld: 97 mg/dL (ref 65–99)
Potassium: 4.5 mmol/L (ref 3.5–5.1)
Sodium: 133 mmol/L — ABNORMAL LOW (ref 135–145)

## 2014-12-06 LAB — CBC
HCT: 25.9 % — ABNORMAL LOW (ref 36.0–46.0)
Hemoglobin: 8.3 g/dL — ABNORMAL LOW (ref 12.0–15.0)
MCH: 29 pg (ref 26.0–34.0)
MCHC: 32 g/dL (ref 30.0–36.0)
MCV: 90.6 fL (ref 78.0–100.0)
Platelets: 223 10*3/uL (ref 150–400)
RBC: 2.86 MIL/uL — ABNORMAL LOW (ref 3.87–5.11)
RDW: 16.5 % — ABNORMAL HIGH (ref 11.5–15.5)
WBC: 8.6 10*3/uL (ref 4.0–10.5)

## 2014-12-06 MED ORDER — PANTOPRAZOLE SODIUM 40 MG PO TBEC: 40.0000 mg | DELAYED_RELEASE_TABLET | Freq: Every day | ORAL | Status: DC

## 2014-12-06 NOTE — Progress Notes (Signed)
PT Cancellation Note  Patient Details Name: [name] MRN: [id number] DOB: January 31, 1924   Cancelled Treatment:    Reason Eval/Treat Not Completed: PT screened, no needs identified, will sign off   Spoke with RN, who reports she is moving well, no PT needs;   Roney Marion, [place]  Ac[facility] Services Pager [phone] Office [phone]    Roney Marion [facility] 12/06/2014, 11:11 AM

## 2014-12-06 NOTE — Care Management Note (Signed)
Case Management Note  Patient Details  Name: [name] MRN: [id number] Date of Birth: 08/15/1923  Subjective/Objective:                CM following for progression and d/c planning    Action/Plan: 12/06/2014 Met with pt and IM given and explained. Plan is for d/c to home where pt is independent , no DME or HH needs.   Expected Discharge Date:    12/06/2014           Expected Discharge Plan:  Home/Self Care  In-House Referral:     Discharge planning Services  CM Consult  Post Acute Care Choice:  NA Choice offered to:  NA  DME Arranged:    DME Agency:     HH Arranged:    HH Agency:     Status of Service:     Medicare Important Message Given:  Yes Date Medicare IM Given:  12/06/14 Medicare IM give by:  Jasmine Pang RN MPH, case manager Date Additional Medicare IM Given:    Additional Medicare Important Message give by:     If discussed at [place] of Stay Meetings, dates discussed:    Additional Comments:  Adron Bene, RN 12/06/2014, 11:22 AM

## 2014-12-06 NOTE — Discharge Instructions (Signed)
Anemia, Nonspecific Anemia is a condition in which the concentration of red blood cells or hemoglobin in the blood is below normal. Hemoglobin is a substance in red blood cells that carries oxygen to the tissues of the body. Anemia results in not enough oxygen reaching these tissues.  CAUSES  Common causes of anemia include:   Excessive bleeding. Bleeding may be internal or external. This includes excessive bleeding from periods (in women) or from the intestine.   Poor nutrition.   Chronic kidney, thyroid, and liver disease.  Bone marrow disorders that decrease red blood cell production.  Cancer and treatments for cancer.  HIV, AIDS, and their treatments.  Spleen problems that increase red blood cell destruction.  Blood disorders.  Excess destruction of red blood cells due to infection, medicines, and autoimmune disorders. SIGNS AND SYMPTOMS   Minor weakness.   Dizziness.   Headache.  Palpitations.   Shortness of breath, especially with exercise.   Paleness.  Cold sensitivity.  Indigestion.  Nausea.  Difficulty sleeping.  Difficulty concentrating. Symptoms may occur suddenly or they may develop slowly.  DIAGNOSIS  Additional blood tests are often needed. These help your health care provider determine the best treatment. Your health care provider will check your stool for blood and look for other causes of blood loss.  TREATMENT  Treatment varies depending on the cause of the anemia. Treatment can include:   Supplements of iron, vitamin B12, or folic acid.   Hormone medicines.   A blood transfusion. This may be needed if blood loss is severe.   Hospitalization. This may be needed if there is significant continual blood loss.   Dietary changes.  Spleen removal. HOME CARE INSTRUCTIONS Keep all follow-up appointments. It often takes many weeks to correct anemia, and having your health care provider check on your condition and your response to  treatment is very important. SEEK IMMEDIATE MEDICAL CARE IF:   You develop extreme weakness, shortness of breath, or chest pain.   You become dizzy or have trouble concentrating.  You develop heavy vaginal bleeding.   You develop a rash.   You have bloody or black, tarry stools.   You faint.   You vomit up blood.   You vomit repeatedly.   You have abdominal pain.  You have a fever or persistent symptoms for more than 2-3 days.   You have a fever and your symptoms suddenly get worse.   You are dehydrated.  MAKE SURE YOU:  Understand these instructions.  Will watch your condition.  Will get help right away if you are not doing well or get worse. Document Released: 08/08/2004 Document Revised: 03/03/2013 Document Reviewed: 12/25/2012 Exit[employer] Patient Information 2015 [employer]. This information is not intended to replace advice given to you by your health care provider. Make sure you discuss any questions you have with your health care provider.  

## 2014-12-06 NOTE — Progress Notes (Signed)
Patient discharged home. Discharge instruction and discharge papers given by Charge nurse. Kipton Skillen, Wonda Cheng, Therapist, sports

## 2014-12-06 NOTE — Progress Notes (Signed)
Discharged papers given to patients with print out education and signs and symptoms of anemia.Patient is alert and oriented x 4 educated and explained,questions answered.Reminded patient to see her primary doctor  After a week as per her primary M.D. appointment.

## 2014-12-06 NOTE — Discharge Summary (Signed)
Physician Discharge Summary  Jameson Morrow CZY:[id number] DOB: Apr 29, 1924 DOA: 12/04/2014  PCP: Dr. Merrilee Seashore  Admit date: 12/04/2014 Discharge date: 12/06/2014  Recommendations for Outpatient Follow-up:  1. Follow up with PCP in week after discharge to recheck hemoglobin level  Discharge Diagnoses:  Principal Problem:   Symptomatic anemia Active Problems:   Essential hypertension   Nausea without vomiting   Hyperlipidemia   Discharge Condition: stable   Diet recommendation: as tolerated   History of present illness:   81 y.o. female with history of hypertension, hyperlipidemia and previous history of hemoptysis presented to the [facility] ED with fatigue and weakness for past few days prior to this admission in addition to poor oral intake and one episode of nonbloody vomiting. No reports of fevers, chills. No reports of blood in the stool or urine.  On admission, patient was found to have hemoglobin of 5.8 and had a 2 units of PRBC transfusion.  Hospital Course:   Assessment/Plan:    Principal Problem:  Symptomatic acute blood loss anemia  - Unclear etiology. Please note the patient refused rectal exam at the time of the admission. - Patient is taking aspirin as needed for pain. Advised not to take aspirin at the time of discharge as it could have exacerbated blood loss anemia. - She follows with primary care physician regularly and is aware that she has chronic anemia. - Patient had CT abdomen on the admission which showed gastric wall thickening particularly in the gastric body, findings concerning for gastric inflammation. Patient refused to have further intervention. She declined having GI consultation. - Patient insists on going home and to follow-up with the primary care physician. - Hemoglobin is stable at this time.  Active Problems:  Essential hypertension - May continue home blood pressure medications   Nausea without vomiting - Unclear etiology. Now  feels fine. Tolerates by mouth intake well.   Acute renal failure - Likely prerenal, poor by mouth intake and dehydration. - Patient will follow-up with primary care physician and have kidney function rechecked.   Obesity  - Body mass index is 32.88 kg/(m^2).   COPD with emphysema - Stable. No shortness of breath. No exacerbation.   Dense structures in both kidneys - ? hemorrhagic or proteinaceous cysts, however, indeterminate - Incidental finding on CT. Outpatient follow-up since patient declines further studies.  DVT prophylaxis  - SCD's bilaterally  Code Status: Full.  Family Communication: plan of care discussed with the patient   IV access:  Peripheral IV  Procedures and diagnostic studies:   Ct Abdomen Pelvis Wo Contrast 12/05/2014 There is concern for areas of gastric wall thickening, particularly in the gastric body. Findings raise concern for gastric inflammation and consider a GI consultation. Dense structures in both kidneys could represent hemorrhagic or proteinaceous cysts but indeterminate. Emphysematous changes at the lung bases. There are a few punctate nodular densities as described. If the patient is at high risk for bronchogenic carcinoma, follow-up chest CT at 1 year is recommended. If the patient is at low risk, no follow-up is needed.   Dg Chest 2 View 12/04/2014 No acute cardiopulmonary process.   Medical Consultants:  None  Other Consultants:  None  IAnti-Infectives:   None   Signed:  Leisa Lenz, MD  [facility] 12/06/2014, 10:07 AM  Pager #: [phone]  Time spent in minutes: less than 30 minutes   Discharge Exam: Filed Vitals:   12/06/14 0459  BP: 127/51  Pulse: 105  Temp: 98.4 F (36.9 C)  Resp:  [place]:   12/05/14 0750 12/05/14 1700 12/05/14 2039 12/06/14 0459  BP: 102/49 141/59 118/45 127/51  Pulse: 76 83 116 105  Temp: 98.6 F (37 C) 98.7 F (37.1 C) 98.5 F (36.9 C) 98.4  F (36.9 C)  TempSrc: Oral Oral Oral Oral  Resp: 16 20 18 18   Height:      Weight:      SpO2: 96% 96% 95% 92%    General: Pt is alert, follows commands appropriately, not in acute distress Cardiovascular: Regular rate and rhythm, S1/S2 +, no murmurs Respiratory: Clear to auscultation bilaterally, no wheezing, no crackles, no rhonchi Abdominal: Soft, non tender, non distended, bowel sounds +, no guarding Extremities: no edema, no cyanosis, pulses palpable bilaterally DP and PT Neuro: Grossly nonfocal  Discharge Instructions  Discharge Instructions    Call MD for:  difficulty breathing, headache or visual disturbances    Complete by:  As directed      Call MD for:  persistant dizziness or light-headedness    Complete by:  As directed      Call MD for:  persistant nausea and vomiting    Complete by:  As directed      Call MD for:  severe uncontrolled pain    Complete by:  As directed      Diet - low sodium heart healthy    Complete by:  As directed      Increase activity slowly    Complete by:  As directed             Medication List    STOP taking these medications        aspirin EC 325 MG tablet      TAKE these medications        acetaminophen 325 MG tablet  Commonly known as:  TYLENOL  Take 650 mg by mouth every 6 (six) hours as needed (pain).     albuterol 108 (90 BASE) MCG/ACT inhaler  Commonly known as:  PROVENTIL HFA;VENTOLIN HFA  Inhale 2 puffs into the lungs every 6 (six) hours as needed for wheezing or shortness of breath.     atorvastatin 20 MG tablet  Commonly known as:  LIPITOR  Take 20 mg by mouth at bedtime.     irbesartan 300 MG tablet  Commonly known as:  AVAPRO  Take 300 mg by mouth at bedtime.     pantoprazole 40 MG tablet  Commonly known as:  PROTONIX  Take 1 tablet (40 mg total) by mouth daily.     timolol 0.5 % ophthalmic solution  Commonly known as:  TIMOPTIC  Place 1 drop into both eyes 2 (two) times daily.     tretinoin 0.1 %  cream  Commonly known as:  RETIN-A  Apply 1 application topically at bedtime as needed (skin care).           Follow-up Information    Follow up with [facility], MD. Schedule an appointment as soon as possible for a visit in 1 week.   Specialty:  Internal Medicine   Why:  Follow up appt after recent hospitalization   Contact information:   [address] [place] [address] [phone]        The results of significant diagnostics from this hospitalization (including imaging, microbiology, ancillary and laboratory) are listed below for reference.    Significant Diagnostic Studies: Ct Abdomen Pelvis Wo Contrast  12/05/2014   CLINICAL DATA:  Nausea, vomiting and dyspnea with  exertion.  EXAM: CT ABDOMEN AND PELVIS WITHOUT CONTRAST  TECHNIQUE: Multidetector CT imaging of the abdomen and pelvis was performed following the standard protocol without IV contrast.  COMPARISON:  None.  FINDINGS: Emphysematous changes and air trapping at the lung bases. Few punctate densities in the lung bases are nonspecific, largest measuring 3 mm. No evidence for pleural effusions. No evidence for free intraperitoneal air.  Unenhanced CT was performed per clinician order. Lack of IV contrast limits sensitivity and specificity, especially for evaluation of abdominal/pelvic solid viscera.  There is diffuse wall thickening of the gastric body and some wall thickening in the gastric cardia. No significant distention of the stomach. No significant inflammatory changes around the stomach.  There are at least 2 are sub cm low-density structures in the liver which are nonspecific. Gallbladder is decompressed. Normal appearance of the spleen, pancreas and adrenal glands. There is a dense structure along the left kidney upper pole with Hounsfield units of 82 and this structure measures roughly 0.8 cm. This is likely a hyperdense or proteinaceous cyst. There is a slightly dense structure in the  inferior right renal sinus which may represent a complex parapelvic cyst. No evidence for hydronephrosis. Normal appearance of the urinary bladder. Uterus has multiple calcified fibroids. No gross abnormality to the adnexal tissue.  Normal appearance of the appendix. No acute abnormality in the small or large bowel.  Atherosclerotic calcifications in the abdominal aorta without aneurysm. There is a slightly prominent right retroperitoneal lymph node just anterior to the right psoas muscle on sequence 201, image 50 that measures 1.4 x 1.1 cm. Otherwise, no significant lymphadenopathy. No evidence free fluid.  No acute bone abnormality.  Disc disease in the lumbar spine.  IMPRESSION: There is concern for areas of gastric wall thickening, particularly in the gastric body. Findings raise concern for gastric inflammation and consider a GI consultation.  Dense structures in both kidneys could represent hemorrhagic or proteinaceous cysts but indeterminate.  Emphysematous changes at the lung bases. There are a few punctate nodular densities as described. If the patient is at high risk for bronchogenic carcinoma, follow-up chest CT at 1 year is recommended. If the patient is at low risk, no follow-up is needed. This recommendation follows the consensus statement: Guidelines for Management of Small Pulmonary Nodules Detected on CT Scans: A Statement from the [place] as published in Radiology 2005; 237:395-400.   Electronically Signed   By: Markus Daft M.D.   On: 12/05/2014 07:41   Dg Chest 2 View  12/04/2014   CLINICAL DATA:  Patient with shortness of breath and dizziness. Nausea for 4 days.  EXAM: CHEST  2 VIEW  COMPARISON:  Chest radiograph 09/24/2013  FINDINGS: Stable cardiac and mediastinal contours with tortuosity of the thoracic aorta. No consolidative pulmonary opacities. No pleural effusion or pneumothorax. Mid thoracic spine degenerative changes.  IMPRESSION: No acute cardiopulmonary process.    Electronically Signed   By: Lovey Newcomer M.D.   On: 12/04/2014 20:48    Microbiology: No results found for this or any previous visit (from the past 240 hour(s)).   Labs: Basic Metabolic Panel:  Recent Labs Lab 12/04/14 2008 12/05/14 1030 12/06/14 0520  NA 133* 136 133*  K 4.1 4.2 4.5  CL 101 106 105  CO2 23 22 21*  GLUCOSE 131* 106* 97  BUN 34* 24* 21*  CREATININE 1.29* 1.30* 1.36*  CALCIUM 8.5* 7.7* 8.1*   Liver Function Tests:  Recent Labs Lab 12/04/14 2008 12/05/14 1030  AST  17 17  ALT 14 12*  ALKPHOS 64 50  BILITOT 0.5 0.7  PROT 6.3* 4.7*  ALBUMIN 3.0* 2.7*    Recent Labs Lab 12/04/14 2008 12/05/14 1030  LIPASE 55* 48   No results for input(s): AMMONIA in the last 168 hours. CBC:  Recent Labs Lab 12/04/14 2008 12/05/14 1030 12/06/14 0520  WBC 8.1 6.7 8.6  NEUTROABS  --  4.5  --   HGB 5.8* 8.0* 8.3*  HCT 18.3* 24.0* 25.9*  MCV 92.9 89.9 90.6  PLT 264 189 223   Cardiac Enzymes:  Recent Labs Lab 12/05/14 1030  TROPONINI <0.03   BNP: BNP (last 3 results)  Recent Labs  12/04/14 2008  BNP 119.7*    ProBNP (last 3 results) No results for input(s): PROBNP in the last 8760 hours.  CBG: No results for input(s): GLUCAP in the last 168 hours.

## 2014-12-15 DIAGNOSIS — D500 Iron deficiency anemia secondary to blood loss (chronic): Secondary | ICD-10-CM | POA: Diagnosis not present

## 2014-12-21 DIAGNOSIS — D649 Anemia, unspecified: Secondary | ICD-10-CM | POA: Diagnosis not present

## 2014-12-21 DIAGNOSIS — Z8639 Personal history of other endocrine, nutritional and metabolic disease: Secondary | ICD-10-CM | POA: Diagnosis not present

## 2014-12-21 DIAGNOSIS — C801 Malignant (primary) neoplasm, unspecified: Secondary | ICD-10-CM | POA: Diagnosis not present

## 2014-12-21 DIAGNOSIS — N2889 Other specified disorders of kidney and ureter: Secondary | ICD-10-CM | POA: Diagnosis not present

## 2014-12-28 DIAGNOSIS — I10 Essential (primary) hypertension: Secondary | ICD-10-CM | POA: Diagnosis not present

## 2014-12-28 DIAGNOSIS — H359 Unspecified retinal disorder: Secondary | ICD-10-CM | POA: Diagnosis not present

## 2014-12-28 DIAGNOSIS — R312 Other microscopic hematuria: Secondary | ICD-10-CM | POA: Diagnosis not present

## 2014-12-28 DIAGNOSIS — M5030 Other cervical disc degeneration, unspecified cervical region: Secondary | ICD-10-CM | POA: Diagnosis not present

## 2014-12-28 DIAGNOSIS — Z23 Encounter for immunization: Secondary | ICD-10-CM | POA: Diagnosis not present

## 2014-12-28 DIAGNOSIS — N183 Chronic kidney disease, stage 3 (moderate): Secondary | ICD-10-CM | POA: Diagnosis not present

## 2014-12-28 DIAGNOSIS — C801 Malignant (primary) neoplasm, unspecified: Secondary | ICD-10-CM | POA: Diagnosis not present

## 2014-12-28 DIAGNOSIS — D649 Anemia, unspecified: Secondary | ICD-10-CM | POA: Diagnosis not present

## 2014-12-28 DIAGNOSIS — E785 Hyperlipidemia, unspecified: Secondary | ICD-10-CM | POA: Diagnosis not present

## 2015-01-02 DIAGNOSIS — N183 Chronic kidney disease, stage 3 (moderate): Secondary | ICD-10-CM | POA: Diagnosis not present

## 2015-01-02 DIAGNOSIS — R312 Other microscopic hematuria: Secondary | ICD-10-CM | POA: Diagnosis not present

## 2015-01-02 DIAGNOSIS — E785 Hyperlipidemia, unspecified: Secondary | ICD-10-CM | POA: Diagnosis not present

## 2015-01-02 DIAGNOSIS — D649 Anemia, unspecified: Secondary | ICD-10-CM | POA: Diagnosis not present

## 2015-01-03 DIAGNOSIS — C651 Malignant neoplasm of right renal pelvis: Secondary | ICD-10-CM | POA: Diagnosis not present

## 2015-01-04 DIAGNOSIS — C651 Malignant neoplasm of right renal pelvis: Secondary | ICD-10-CM | POA: Diagnosis not present

## 2015-01-11 DIAGNOSIS — B372 Candidiasis of skin and nail: Secondary | ICD-10-CM | POA: Diagnosis not present

## 2015-01-12 DIAGNOSIS — C651 Malignant neoplasm of right renal pelvis: Secondary | ICD-10-CM | POA: Diagnosis not present

## 2015-01-12 DIAGNOSIS — C641 Malignant neoplasm of right kidney, except renal pelvis: Secondary | ICD-10-CM | POA: Diagnosis not present

## 2015-02-03 DIAGNOSIS — C651 Malignant neoplasm of right renal pelvis: Secondary | ICD-10-CM | POA: Diagnosis not present

## 2015-02-14 DIAGNOSIS — D649 Anemia, unspecified: Secondary | ICD-10-CM | POA: Diagnosis not present

## 2015-02-15 DIAGNOSIS — K449 Diaphragmatic hernia without obstruction or gangrene: Secondary | ICD-10-CM | POA: Diagnosis not present

## 2015-02-15 DIAGNOSIS — K2950 Unspecified chronic gastritis without bleeding: Secondary | ICD-10-CM | POA: Diagnosis not present

## 2015-02-15 DIAGNOSIS — E785 Hyperlipidemia, unspecified: Secondary | ICD-10-CM | POA: Diagnosis not present

## 2015-02-15 DIAGNOSIS — Z85528 Personal history of other malignant neoplasm of kidney: Secondary | ICD-10-CM | POA: Diagnosis not present

## 2015-02-15 DIAGNOSIS — D649 Anemia, unspecified: Secondary | ICD-10-CM | POA: Diagnosis not present

## 2015-02-15 DIAGNOSIS — Z91041 Radiographic dye allergy status: Secondary | ICD-10-CM | POA: Diagnosis not present

## 2015-02-15 DIAGNOSIS — Z87891 Personal history of nicotine dependence: Secondary | ICD-10-CM | POA: Diagnosis not present

## 2015-02-15 DIAGNOSIS — Z880 Allergy status to penicillin: Secondary | ICD-10-CM | POA: Diagnosis not present

## 2015-02-15 DIAGNOSIS — I10 Essential (primary) hypertension: Secondary | ICD-10-CM | POA: Diagnosis not present

## 2015-02-15 DIAGNOSIS — D500 Iron deficiency anemia secondary to blood loss (chronic): Secondary | ICD-10-CM | POA: Diagnosis not present

## 2015-02-15 DIAGNOSIS — B9681 Helicobacter pylori [H. pylori] as the cause of diseases classified elsewhere: Secondary | ICD-10-CM | POA: Diagnosis not present

## 2015-03-06 DIAGNOSIS — Z8619 Personal history of other infectious and parasitic diseases: Secondary | ICD-10-CM | POA: Diagnosis not present

## 2015-03-06 DIAGNOSIS — N183 Chronic kidney disease, stage 3 (moderate): Secondary | ICD-10-CM | POA: Diagnosis not present

## 2015-03-06 DIAGNOSIS — R1031 Right lower quadrant pain: Secondary | ICD-10-CM | POA: Diagnosis not present

## 2015-03-06 DIAGNOSIS — R109 Unspecified abdominal pain: Secondary | ICD-10-CM | POA: Diagnosis not present

## 2015-03-06 DIAGNOSIS — C801 Malignant (primary) neoplasm, unspecified: Secondary | ICD-10-CM | POA: Diagnosis not present

## 2015-03-09 DIAGNOSIS — Z87891 Personal history of nicotine dependence: Secondary | ICD-10-CM | POA: Diagnosis not present

## 2015-03-09 DIAGNOSIS — R1031 Right lower quadrant pain: Secondary | ICD-10-CM | POA: Diagnosis not present

## 2015-03-09 DIAGNOSIS — I6529 Occlusion and stenosis of unspecified carotid artery: Secondary | ICD-10-CM | POA: Diagnosis not present

## 2015-03-09 DIAGNOSIS — E785 Hyperlipidemia, unspecified: Secondary | ICD-10-CM | POA: Diagnosis not present

## 2015-03-09 DIAGNOSIS — I10 Essential (primary) hypertension: Secondary | ICD-10-CM | POA: Diagnosis not present

## 2015-03-09 DIAGNOSIS — B029 Zoster without complications: Secondary | ICD-10-CM | POA: Diagnosis not present

## 2015-03-09 DIAGNOSIS — Z8553 Personal history of malignant neoplasm of renal pelvis: Secondary | ICD-10-CM | POA: Diagnosis not present

## 2015-03-09 DIAGNOSIS — R21 Rash and other nonspecific skin eruption: Secondary | ICD-10-CM | POA: Diagnosis not present

## 2015-03-14 DIAGNOSIS — E785 Hyperlipidemia, unspecified: Secondary | ICD-10-CM | POA: Diagnosis not present

## 2015-03-14 DIAGNOSIS — C801 Malignant (primary) neoplasm, unspecified: Secondary | ICD-10-CM | POA: Diagnosis not present

## 2015-03-14 DIAGNOSIS — M5030 Other cervical disc degeneration, unspecified cervical region: Secondary | ICD-10-CM | POA: Diagnosis not present

## 2015-03-14 DIAGNOSIS — B029 Zoster without complications: Secondary | ICD-10-CM | POA: Diagnosis not present

## 2015-03-14 DIAGNOSIS — N183 Chronic kidney disease, stage 3 (moderate): Secondary | ICD-10-CM | POA: Diagnosis not present

## 2015-03-14 DIAGNOSIS — B0229 Other postherpetic nervous system involvement: Secondary | ICD-10-CM | POA: Diagnosis not present

## 2015-03-14 DIAGNOSIS — D649 Anemia, unspecified: Secondary | ICD-10-CM | POA: Diagnosis not present

## 2015-03-14 DIAGNOSIS — R312 Other microscopic hematuria: Secondary | ICD-10-CM | POA: Diagnosis not present

## 2015-03-14 DIAGNOSIS — I10 Essential (primary) hypertension: Secondary | ICD-10-CM | POA: Diagnosis not present

## 2015-03-16 DIAGNOSIS — Z8619 Personal history of other infectious and parasitic diseases: Secondary | ICD-10-CM | POA: Diagnosis not present

## 2015-03-18 DIAGNOSIS — B029 Zoster without complications: Secondary | ICD-10-CM | POA: Diagnosis not present

## 2015-03-21 DIAGNOSIS — K550 Acute vascular disorders of intestine: Secondary | ICD-10-CM | POA: Diagnosis not present

## 2015-03-21 DIAGNOSIS — R1031 Right lower quadrant pain: Secondary | ICD-10-CM | POA: Diagnosis not present

## 2015-03-21 DIAGNOSIS — C641 Malignant neoplasm of right kidney, except renal pelvis: Secondary | ICD-10-CM | POA: Diagnosis not present

## 2015-03-21 DIAGNOSIS — N183 Chronic kidney disease, stage 3 (moderate): Secondary | ICD-10-CM | POA: Diagnosis not present

## 2015-03-21 DIAGNOSIS — C651 Malignant neoplasm of right renal pelvis: Secondary | ICD-10-CM | POA: Diagnosis not present

## 2015-03-28 DIAGNOSIS — D495 Neoplasm of unspecified behavior of other genitourinary organs: Secondary | ICD-10-CM | POA: Diagnosis not present

## 2015-04-13 DIAGNOSIS — I10 Essential (primary) hypertension: Secondary | ICD-10-CM | POA: Diagnosis not present

## 2015-04-13 DIAGNOSIS — I6789 Other cerebrovascular disease: Secondary | ICD-10-CM | POA: Diagnosis not present

## 2015-04-13 DIAGNOSIS — E785 Hyperlipidemia, unspecified: Secondary | ICD-10-CM | POA: Diagnosis not present

## 2015-04-13 DIAGNOSIS — R4781 Slurred speech: Secondary | ICD-10-CM | POA: Diagnosis not present

## 2015-04-13 DIAGNOSIS — R4701 Aphasia: Secondary | ICD-10-CM | POA: Diagnosis not present

## 2015-04-13 DIAGNOSIS — Z87891 Personal history of nicotine dependence: Secondary | ICD-10-CM | POA: Diagnosis not present

## 2015-04-17 DIAGNOSIS — R3129 Other microscopic hematuria: Secondary | ICD-10-CM | POA: Diagnosis not present

## 2015-04-17 DIAGNOSIS — I10 Essential (primary) hypertension: Secondary | ICD-10-CM | POA: Diagnosis not present

## 2015-04-17 DIAGNOSIS — D649 Anemia, unspecified: Secondary | ICD-10-CM | POA: Diagnosis not present

## 2015-04-17 DIAGNOSIS — E785 Hyperlipidemia, unspecified: Secondary | ICD-10-CM | POA: Diagnosis not present

## 2015-04-17 DIAGNOSIS — C801 Malignant (primary) neoplasm, unspecified: Secondary | ICD-10-CM | POA: Diagnosis not present

## 2015-04-17 DIAGNOSIS — Z23 Encounter for immunization: Secondary | ICD-10-CM | POA: Diagnosis not present

## 2015-04-17 DIAGNOSIS — B0229 Other postherpetic nervous system involvement: Secondary | ICD-10-CM | POA: Diagnosis not present

## 2015-04-17 DIAGNOSIS — N183 Chronic kidney disease, stage 3 (moderate): Secondary | ICD-10-CM | POA: Diagnosis not present

## 2015-04-17 DIAGNOSIS — R4701 Aphasia: Secondary | ICD-10-CM | POA: Diagnosis not present

## 2015-05-09 DIAGNOSIS — I10 Essential (primary) hypertension: Secondary | ICD-10-CM | POA: Diagnosis not present

## 2015-05-09 DIAGNOSIS — R319 Hematuria, unspecified: Secondary | ICD-10-CM | POA: Diagnosis not present

## 2015-05-09 DIAGNOSIS — J4521 Mild intermittent asthma with (acute) exacerbation: Secondary | ICD-10-CM | POA: Diagnosis not present

## 2015-05-09 DIAGNOSIS — E782 Mixed hyperlipidemia: Secondary | ICD-10-CM | POA: Diagnosis not present

## 2015-05-24 IMAGING — CT CT ABD-PELV W/O CM
2 of 4 series · 11 of 46 positions shown, 12 images · non-contrast
Comparison: None.

CLINICAL DATA: Nausea, vomiting and dyspnea with exertion.

EXAM:
CT ABDOMEN AND PELVIS WITHOUT CONTRAST
TECHNIQUE: Multidetector CT imaging of the abdomen and pelvis was performed
following the standard protocol without IV contrast.

[Series 201: routine, idose (2) · axial · 0.78mm/px · z∈[-584,-224]mm · 8 of 90 slices shown, 9 images]
[im 9/90  soft-tissue]
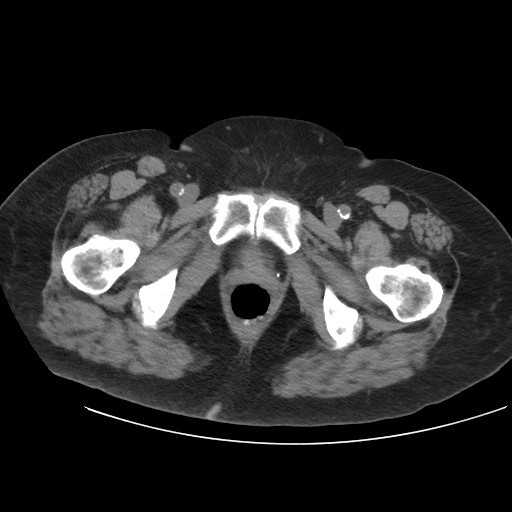
[im 9/90  bone]
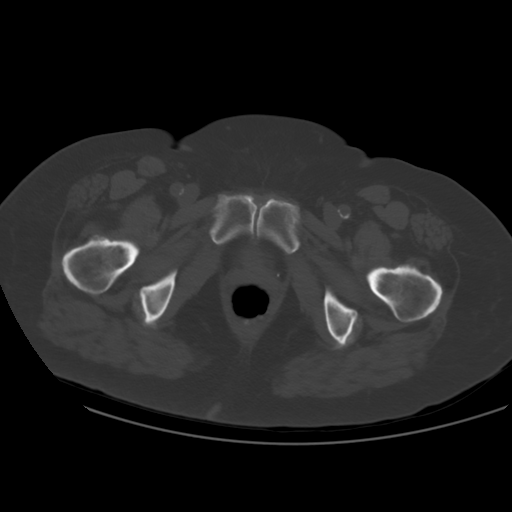
[im 17/90  soft-tissue]
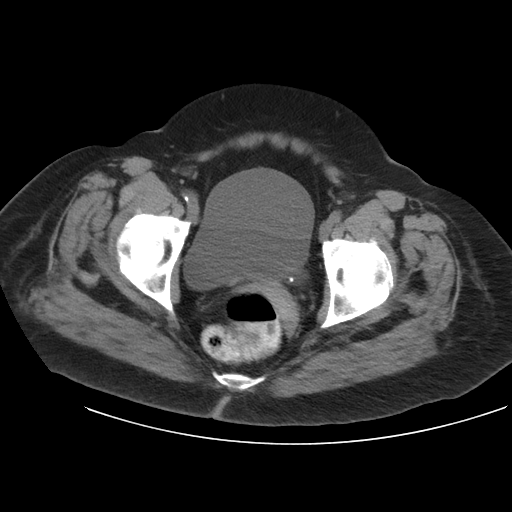
[im 30/90  soft-tissue]
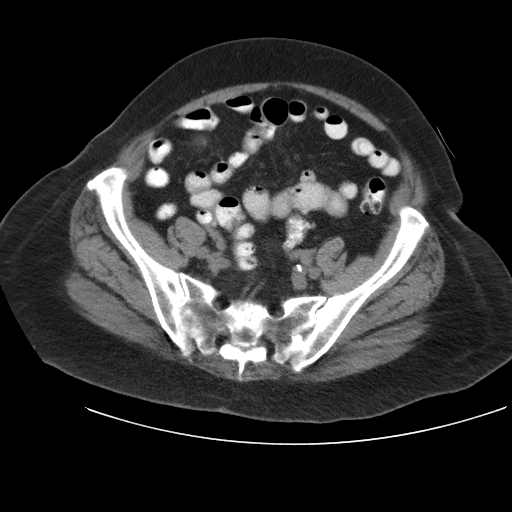
[im 39/90  soft-tissue]
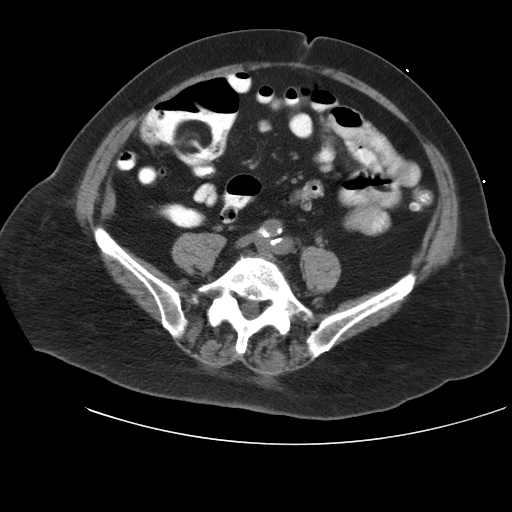
[im 51/90  soft-tissue]
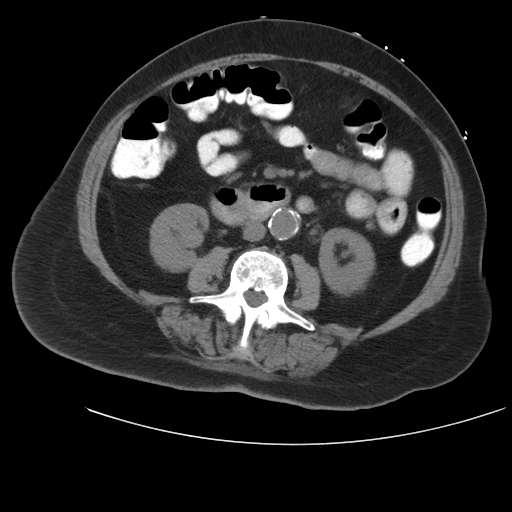
[im 60/90  soft-tissue]
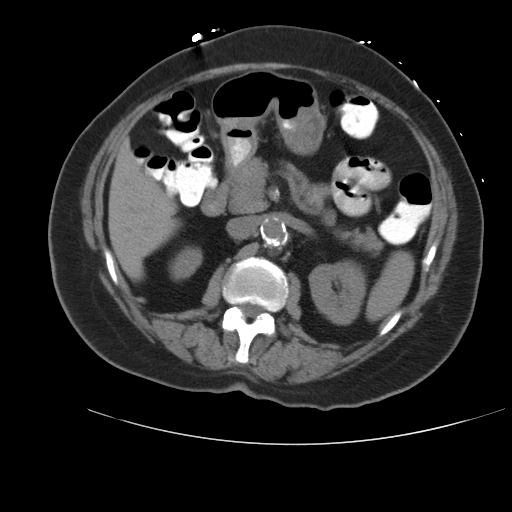
[im 73/90  soft-tissue]
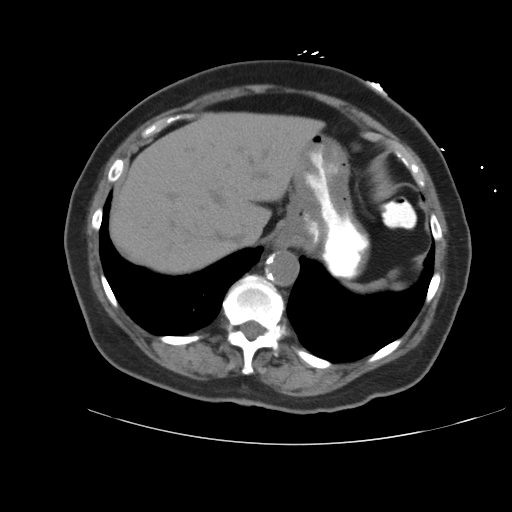
[im 81/90  soft-tissue]
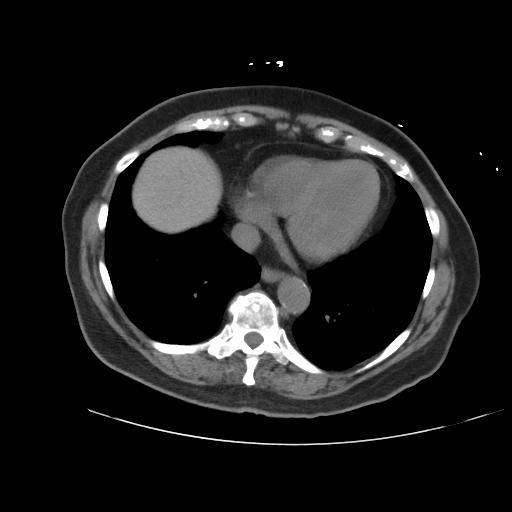

[Series 202: coronals, idose (2) · coronal · 0.50mm/px · 3 of 129 slices shown]
[im 43/129  soft-tissue]
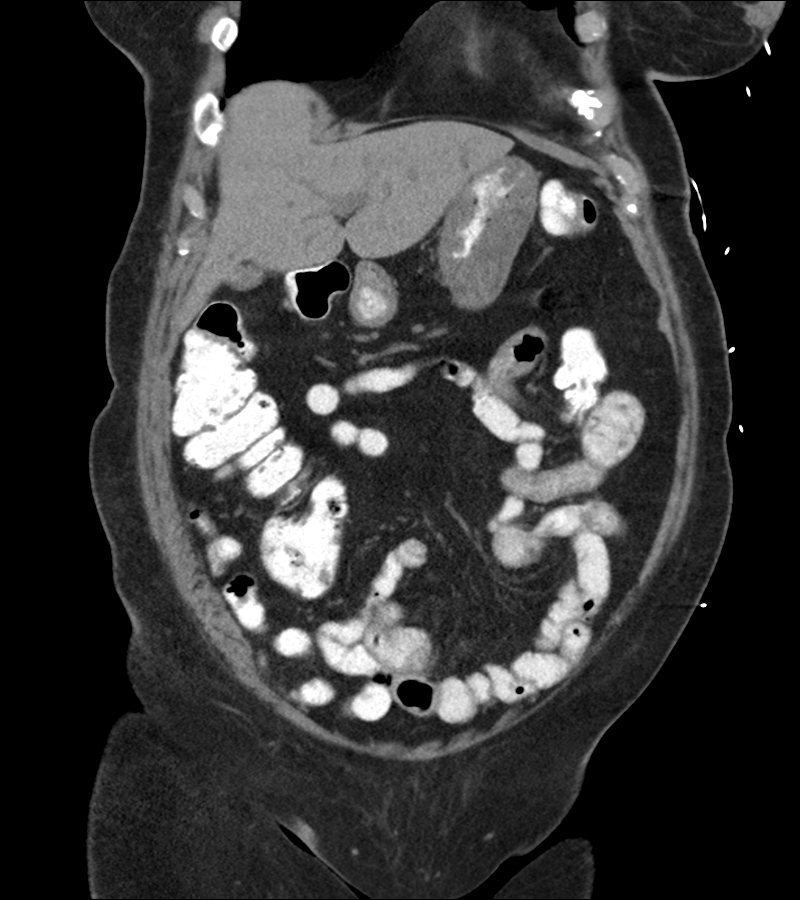
[im 57/129  soft-tissue]
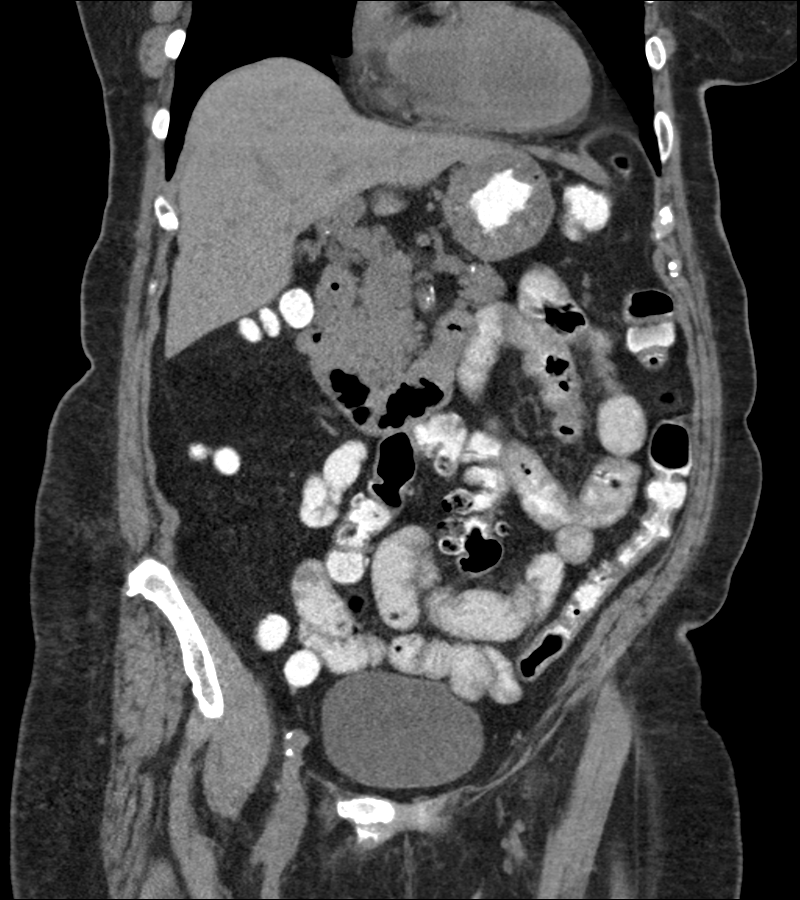
[im 72/129  soft-tissue]
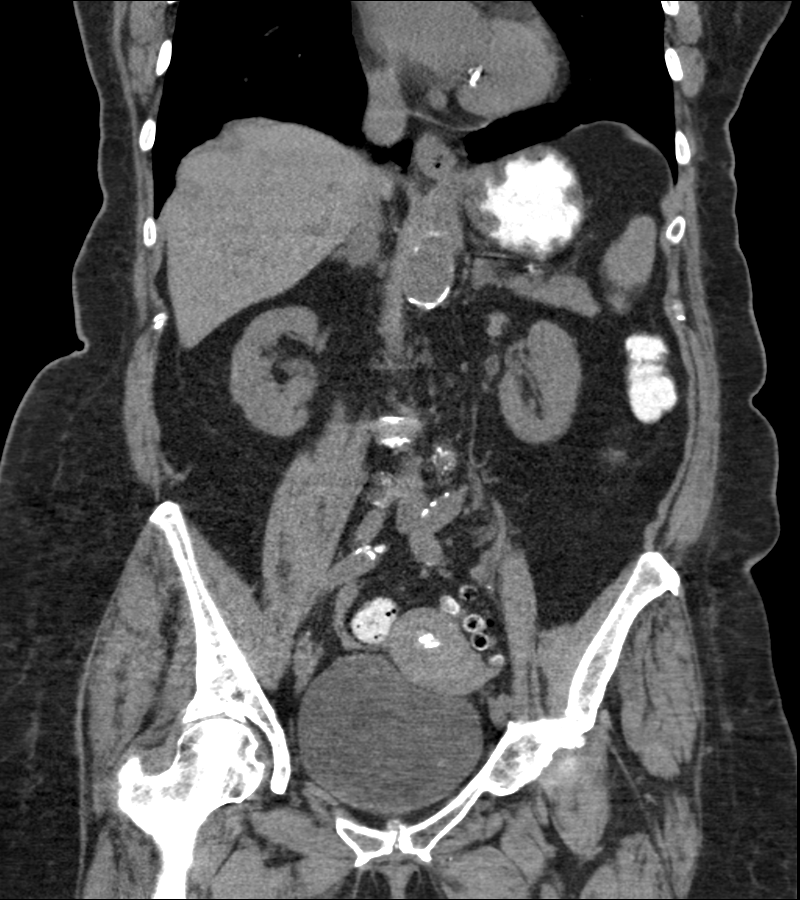

[11 of 46 positions shown; findings below may reference images not displayed]

FINDINGS: Emphysematous changes and air trapping at the lung bases. Few
punctate densities in the lung bases are nonspecific, largest
measuring 3 mm. No evidence for pleural effusions. No evidence for
free intraperitoneal air.

Unenhanced CT was performed per clinician order. Lack of IV contrast
limits sensitivity and specificity, especially for evaluation of
abdominal/pelvic solid viscera.

There is diffuse wall thickening of the gastric body and some wall
thickening in the gastric cardia. No significant distention of the
stomach. No significant inflammatory changes around the stomach.

There are at least 2 are sub cm low-density structures in the liver
which are nonspecific. Gallbladder is decompressed. Normal
appearance of the spleen, pancreas and adrenal glands. There is a
dense structure along the left kidney upper pole with Hounsfield
units of 82 and this structure measures roughly 0.8 cm. This is
likely a hyperdense or proteinaceous cyst. There is a slightly dense
structure in the inferior right renal sinus which may represent a
complex parapelvic cyst. No evidence for hydronephrosis. Normal
appearance of the urinary bladder. Uterus has multiple calcified
fibroids. No gross abnormality to the adnexal tissue.

Normal appearance of the appendix. No acute abnormality in the small
or large bowel.

Atherosclerotic calcifications in the abdominal aorta without
aneurysm. There is a slightly prominent right retroperitoneal lymph
node just anterior to the right psoas muscle on sequence 201, image
50 that measures 1.4 x 1.1 cm. Otherwise, no significant
lymphadenopathy. No evidence free fluid.

No acute bone abnormality.  Disc disease in the lumbar spine.
IMPRESSION: There is concern for areas of gastric wall thickening, particularly
in the gastric body. Findings raise concern for gastric inflammation
and consider a GI consultation.

Dense structures in both kidneys could represent hemorrhagic or
proteinaceous cysts but indeterminate.

Emphysematous changes at the lung bases. There are a few punctate
nodular densities as described. If the patient is at high risk for
bronchogenic carcinoma, follow-up chest CT at 1 year is recommended.
If the patient is at low risk, no follow-up is needed. This
recommendation follows the consensus statement: Guidelines for
Management of Small Pulmonary Nodules Detected on CT Scans: A
Statement from the [HOSPITAL] as published in Radiology

## 2015-07-20 DIAGNOSIS — H2511 Age-related nuclear cataract, right eye: Secondary | ICD-10-CM | POA: Diagnosis not present

## 2015-07-20 DIAGNOSIS — H348112 Central retinal vein occlusion, right eye, stable: Secondary | ICD-10-CM | POA: Diagnosis not present

## 2015-07-20 DIAGNOSIS — H401490 Capsular glaucoma with pseudoexfoliation of lens, unspecified eye, stage unspecified: Secondary | ICD-10-CM | POA: Diagnosis not present

## 2015-07-20 DIAGNOSIS — H35372 Puckering of macula, left eye: Secondary | ICD-10-CM | POA: Diagnosis not present

## 2015-07-20 DIAGNOSIS — Z961 Presence of intraocular lens: Secondary | ICD-10-CM | POA: Diagnosis not present

## 2015-07-20 DIAGNOSIS — H353190 Nonexudative age-related macular degeneration, unspecified eye, stage unspecified: Secondary | ICD-10-CM | POA: Diagnosis not present

## 2015-08-01 DIAGNOSIS — R319 Hematuria, unspecified: Secondary | ICD-10-CM | POA: Diagnosis not present

## 2015-08-01 DIAGNOSIS — I10 Essential (primary) hypertension: Secondary | ICD-10-CM | POA: Diagnosis not present

## 2015-08-01 DIAGNOSIS — J4521 Mild intermittent asthma with (acute) exacerbation: Secondary | ICD-10-CM | POA: Diagnosis not present

## 2015-08-01 DIAGNOSIS — D500 Iron deficiency anemia secondary to blood loss (chronic): Secondary | ICD-10-CM | POA: Diagnosis not present

## 2015-08-01 DIAGNOSIS — E782 Mixed hyperlipidemia: Secondary | ICD-10-CM | POA: Diagnosis not present

## 2015-08-08 DIAGNOSIS — N390 Urinary tract infection, site not specified: Secondary | ICD-10-CM | POA: Diagnosis not present

## 2015-08-08 DIAGNOSIS — E782 Mixed hyperlipidemia: Secondary | ICD-10-CM | POA: Diagnosis not present

## 2015-08-08 DIAGNOSIS — C641 Malignant neoplasm of right kidney, except renal pelvis: Secondary | ICD-10-CM | POA: Diagnosis not present

## 2015-08-08 DIAGNOSIS — I10 Essential (primary) hypertension: Secondary | ICD-10-CM | POA: Diagnosis not present

## 2015-08-08 DIAGNOSIS — R319 Hematuria, unspecified: Secondary | ICD-10-CM | POA: Diagnosis not present

## 2015-08-15 ENCOUNTER — Other Ambulatory Visit: Payer: Self-pay | Admitting: Internal Medicine

## 2015-08-15 DIAGNOSIS — R319 Hematuria, unspecified: Secondary | ICD-10-CM

## 2015-08-18 ENCOUNTER — Ambulatory Visit
Admission: RE | Admit: 2015-08-18 | Discharge: 2015-08-18 | Disposition: A | Discharge status: Home / Self Care | Payer: Medicare Other | Source: Ambulatory Visit | Attending: Internal Medicine | Admitting: Internal Medicine

## 2015-08-18 DIAGNOSIS — R319 Hematuria, unspecified: Secondary | ICD-10-CM

## 2015-08-18 DIAGNOSIS — N1330 Unspecified hydronephrosis: Secondary | ICD-10-CM | POA: Diagnosis not present

## 2015-09-01 DIAGNOSIS — H401421 Capsular glaucoma with pseudoexfoliation of lens, left eye, mild stage: Secondary | ICD-10-CM | POA: Diagnosis not present

## 2015-09-13 DIAGNOSIS — H401414 Capsular glaucoma with pseudoexfoliation of lens, right eye, indeterminate stage: Secondary | ICD-10-CM | POA: Diagnosis not present

## 2015-09-13 DIAGNOSIS — H401421 Capsular glaucoma with pseudoexfoliation of lens, left eye, mild stage: Secondary | ICD-10-CM | POA: Diagnosis not present

## 2015-09-19 DIAGNOSIS — E782 Mixed hyperlipidemia: Secondary | ICD-10-CM | POA: Diagnosis not present

## 2015-09-19 DIAGNOSIS — D500 Iron deficiency anemia secondary to blood loss (chronic): Secondary | ICD-10-CM | POA: Diagnosis not present

## 2015-09-19 DIAGNOSIS — I10 Essential (primary) hypertension: Secondary | ICD-10-CM | POA: Diagnosis not present

## 2015-09-19 DIAGNOSIS — R319 Hematuria, unspecified: Secondary | ICD-10-CM | POA: Diagnosis not present

## 2015-11-13 DIAGNOSIS — M25571 Pain in right ankle and joints of right foot: Secondary | ICD-10-CM | POA: Diagnosis not present

## 2015-11-13 DIAGNOSIS — M109 Gout, unspecified: Secondary | ICD-10-CM | POA: Diagnosis not present

## 2015-11-13 DIAGNOSIS — M7989 Other specified soft tissue disorders: Secondary | ICD-10-CM | POA: Diagnosis not present

## 2015-12-05 DIAGNOSIS — I10 Essential (primary) hypertension: Secondary | ICD-10-CM | POA: Diagnosis not present

## 2015-12-05 DIAGNOSIS — I6529 Occlusion and stenosis of unspecified carotid artery: Secondary | ICD-10-CM | POA: Diagnosis not present

## 2015-12-05 DIAGNOSIS — M25571 Pain in right ankle and joints of right foot: Secondary | ICD-10-CM | POA: Diagnosis not present

## 2015-12-05 DIAGNOSIS — N390 Urinary tract infection, site not specified: Secondary | ICD-10-CM | POA: Diagnosis not present

## 2015-12-05 DIAGNOSIS — R319 Hematuria, unspecified: Secondary | ICD-10-CM | POA: Diagnosis not present

## 2015-12-07 DIAGNOSIS — H2511 Age-related nuclear cataract, right eye: Secondary | ICD-10-CM | POA: Diagnosis not present

## 2015-12-07 DIAGNOSIS — H348112 Central retinal vein occlusion, right eye, stable: Secondary | ICD-10-CM | POA: Diagnosis not present

## 2015-12-07 DIAGNOSIS — H35372 Puckering of macula, left eye: Secondary | ICD-10-CM | POA: Diagnosis not present

## 2015-12-07 DIAGNOSIS — Z961 Presence of intraocular lens: Secondary | ICD-10-CM | POA: Diagnosis not present

## 2015-12-07 DIAGNOSIS — H401421 Capsular glaucoma with pseudoexfoliation of lens, left eye, mild stage: Secondary | ICD-10-CM | POA: Diagnosis not present

## 2015-12-07 DIAGNOSIS — H353132 Nonexudative age-related macular degeneration, bilateral, intermediate dry stage: Secondary | ICD-10-CM | POA: Diagnosis not present

## 2015-12-11 ENCOUNTER — Emergency Department (HOSPITAL_COMMUNITY)
Admission: EM | Admit: 2015-12-11 | Discharge: 2015-12-12 | Discharge status: Against Medical Advice | Payer: Medicare Other | Attending: Emergency Medicine | Admitting: Emergency Medicine

## 2015-12-11 ENCOUNTER — Encounter (HOSPITAL_COMMUNITY): Payer: Self-pay | Admitting: *Deleted

## 2015-12-11 ENCOUNTER — Encounter (HOSPITAL_COMMUNITY): Payer: Self-pay

## 2015-12-11 ENCOUNTER — Ambulatory Visit (INDEPENDENT_AMBULATORY_CARE_PROVIDER_SITE_OTHER): Payer: Medicare Other

## 2015-12-11 ENCOUNTER — Ambulatory Visit (HOSPITAL_COMMUNITY)
Admission: EM | Admit: 2015-12-11 | Discharge: 2015-12-11 | Disposition: A | Discharge status: Nursing Home (Includes ICF) | Payer: Medicare Other | Attending: Family Medicine | Admitting: Family Medicine

## 2015-12-11 DIAGNOSIS — Z79899 Other long term (current) drug therapy: Secondary | ICD-10-CM | POA: Diagnosis not present

## 2015-12-11 DIAGNOSIS — R110 Nausea: Secondary | ICD-10-CM | POA: Diagnosis not present

## 2015-12-11 DIAGNOSIS — Z880 Allergy status to penicillin: Secondary | ICD-10-CM | POA: Diagnosis not present

## 2015-12-11 DIAGNOSIS — N182 Chronic kidney disease, stage 2 (mild): Secondary | ICD-10-CM

## 2015-12-11 DIAGNOSIS — Z8709 Personal history of other diseases of the respiratory system: Secondary | ICD-10-CM | POA: Insufficient documentation

## 2015-12-11 DIAGNOSIS — R531 Weakness: Secondary | ICD-10-CM | POA: Diagnosis not present

## 2015-12-11 DIAGNOSIS — D649 Anemia, unspecified: Secondary | ICD-10-CM | POA: Diagnosis not present

## 2015-12-11 DIAGNOSIS — Z87891 Personal history of nicotine dependence: Secondary | ICD-10-CM | POA: Diagnosis not present

## 2015-12-11 DIAGNOSIS — I10 Essential (primary) hypertension: Secondary | ICD-10-CM | POA: Insufficient documentation

## 2015-12-11 DIAGNOSIS — Z8619 Personal history of other infectious and parasitic diseases: Secondary | ICD-10-CM | POA: Insufficient documentation

## 2015-12-11 DIAGNOSIS — E785 Hyperlipidemia, unspecified: Secondary | ICD-10-CM | POA: Diagnosis not present

## 2015-12-11 DIAGNOSIS — D631 Anemia in chronic kidney disease: Secondary | ICD-10-CM | POA: Diagnosis not present

## 2015-12-11 DIAGNOSIS — R799 Abnormal finding of blood chemistry, unspecified: Secondary | ICD-10-CM | POA: Diagnosis present

## 2015-12-11 LAB — CBC WITH DIFFERENTIAL/PLATELET
Basophils Absolute: 0 10*3/uL (ref 0.0–0.1)
Basophils Relative: 1 %
EOS PCT: 7 %
Eosinophils Absolute: 0.5 10*3/uL (ref 0.0–0.7)
HCT: 20.1 % — ABNORMAL LOW (ref 36.0–46.0)
Hemoglobin: 6.4 g/dL — CL (ref 12.0–15.0)
Lymphocytes Relative: 19 %
Lymphs Abs: 1.3 10*3/uL (ref 0.7–4.0)
MCH: 30 pg (ref 26.0–34.0)
MCHC: 31.8 g/dL (ref 30.0–36.0)
MCV: 94.4 fL (ref 78.0–100.0)
MONO ABS: 0.8 10*3/uL (ref 0.1–1.0)
Monocytes Relative: 12 %
Neutro Abs: 4.1 10*3/uL (ref 1.7–7.7)
Neutrophils Relative %: 62 %
PLATELETS: 346 10*3/uL (ref 150–400)
RBC: 2.13 MIL/uL — ABNORMAL LOW (ref 3.87–5.11)
RDW: 16 % — AB (ref 11.5–15.5)
WBC: 6.7 10*3/uL (ref 4.0–10.5)

## 2015-12-11 LAB — COMPREHENSIVE METABOLIC PANEL
ALT: 13 U/L — ABNORMAL LOW (ref 14–54)
ANION GAP: 6 (ref 5–15)
AST: 15 U/L (ref 15–41)
Albumin: 3.7 g/dL (ref 3.5–5.0)
Alkaline Phosphatase: 69 U/L (ref 38–126)
BUN: 41 mg/dL — AB (ref 6–20)
CO2: 24 mmol/L (ref 22–32)
Calcium: 9.2 mg/dL (ref 8.9–10.3)
Chloride: 102 mmol/L (ref 101–111)
Creatinine, Ser: 1.72 mg/dL — ABNORMAL HIGH (ref 0.44–1.00)
GFR calc Af Amer: 29 mL/min — ABNORMAL LOW (ref >60)
GFR, EST NON AFRICAN AMERICAN: 25 mL/min — AB (ref >60)
Glucose, Bld: 114 mg/dL — ABNORMAL HIGH (ref 65–99)
POTASSIUM: 4.3 mmol/L (ref 3.5–5.1)
Sodium: 132 mmol/L — ABNORMAL LOW (ref 135–145)
TOTAL PROTEIN: 6.4 g/dL — AB (ref 6.5–8.1)
Total Bilirubin: 0.2 mg/dL — ABNORMAL LOW (ref 0.3–1.2)

## 2015-12-11 LAB — POCT I-STAT, CHEM 8
BUN: 42 mg/dL — AB (ref 6–20)
CALCIUM ION: 1.18 mmol/L (ref 1.13–1.30)
Chloride: 101 mmol/L (ref 101–111)
Creatinine, Ser: 1.6 mg/dL — ABNORMAL HIGH (ref 0.44–1.00)
GLUCOSE: 117 mg/dL — AB (ref 65–99)
HEMATOCRIT: 18 % — AB (ref 36.0–46.0)
Hemoglobin: 6.1 g/dL — CL (ref 12.0–15.0)
POTASSIUM: 4.4 mmol/L (ref 3.5–5.1)
SODIUM: 135 mmol/L (ref 135–145)
TCO2: 23 mmol/L (ref 0–100)

## 2015-12-11 LAB — PREPARE RBC (CROSSMATCH)

## 2015-12-11 MED ORDER — SODIUM CHLORIDE 0.9 % IV SOLN: 10.0000 mL/h | Freq: Once | INTRAVENOUS | Status: DC

## 2015-12-11 NOTE — ED Notes (Signed)
[facility] called to notify this RN that patient has been sent here to this ED via shuttle due to a hgb of 6 and also her BUN was abnormal. Pt went to [facility] today for weakness.

## 2015-12-11 NOTE — ED Provider Notes (Signed)
CSN: [id number]     Arrival date & time 12/11/15  1804 History   First MD Initiated Contact with Patient 12/11/15 1840     Chief Complaint  Patient presents with  . Abnormal Lab     (Consider location/radiation/quality/duration/timing/severity/associated sxs/prior Treatment) HPI   For about 1 week, has had generalized weakness, nausea, shortness of breath with exertion, better with rest.  No lightheadedness unless moving, and it depends on how long she moves. Comes in waves.  No chest pain. Slight headache.  No bloody stools/no vaginal bleeding, no hemoptysis.  Black stools for as long as she can remember, since taking iron.  Was admitted last year, CT showed gastric wall thickening, pt declined further testing including declined rectal exam and GI evaluation  Hx of urothelial transitional cell carcinoma, had mass removed 03/2014.  Had labs drawn last Tues by PCP, does not know results    Past Medical History  Diagnosis Date  . Pulmonary hemorrhage 2003  . Allergy   . Bronchospasm   . Shingles   . Hypertension   . HLD (hyperlipidemia)    History reviewed. No pertinent past surgical history. Family History  Problem Relation Age of Onset  . Stroke Father   . Lung cancer Brother    Social History  Substance Use Topics  . Smoking status: Former Research scientist (life sciences)  . Smokeless tobacco: Never Used  . Alcohol Use: No   OB History    No data available     Review of Systems  Constitutional: Positive for fatigue. Negative for fever.  HENT: Negative for sore throat.   Eyes: Negative for visual disturbance.  Respiratory: Positive for shortness of breath. Negative for cough.   Cardiovascular: Negative for chest pain.  Gastrointestinal: Positive for nausea. Negative for vomiting, abdominal pain, diarrhea, constipation and blood in stool.  Genitourinary: Negative for hematuria, vaginal bleeding and difficulty urinating.  Musculoskeletal: Negative for back pain and neck pain.  Skin:  Negative for rash.  Neurological: Positive for light-headedness. Negative for syncope and headaches.      Allergies  Ivp dye and Penicillins  Home Medications   Prior to Admission medications   Medication Sig Start Date End Date Taking? Authorizing Provider  acetaminophen (TYLENOL) 325 MG tablet Take 650 mg by mouth every 6 (six) hours as needed (pain).   Yes Historical Provider, MD  atorvastatin (LIPITOR) 20 MG tablet Take 20 mg by mouth at bedtime.    Yes Historical Provider, MD  Ferrous Sulfate (IRON) 28 MG TABS Take 28 mg by mouth every morning.   Yes Historical Provider, MD  irbesartan (AVAPRO) 300 MG tablet Take 300 mg by mouth at bedtime.   Yes Historical Provider, MD  LUTEIN PO Take 1 tablet by mouth every morning.   Yes Historical Provider, MD  timolol (TIMOPTIC) 0.5 % ophthalmic solution Place 1 drop into both eyes 2 (two) times daily.  11/21/14  Yes Historical Provider, MD  tretinoin (RETIN-A) 0.1 % cream Apply 1 application topically at bedtime as needed (skin care).    Yes Historical Provider, MD   BP 159/67 mmHg  Pulse 77  Temp(Src) 98.1 F (36.7 C) (Oral)  Resp 17  SpO2 99% Physical Exam  Constitutional: She is oriented to person, place, and time. She appears well-developed and well-nourished. No distress.  HENT:  Head: Normocephalic and atraumatic.  Eyes: Conjunctivae and EOM are normal.  Neck: Normal range of motion.  Cardiovascular: Normal rate, regular rhythm, normal heart sounds and intact distal pulses.  Exam reveals  no gallop and no friction rub.   No murmur heard. Pulmonary/Chest: Effort normal and breath sounds normal. No respiratory distress. She has no wheezes. She has no rales.  Abdominal: Soft. She exhibits no distension. There is no tenderness. There is no guarding.  Musculoskeletal: She exhibits no edema or tenderness.  Neurological: She is alert and oriented to person, place, and time.  Skin: Skin is warm and dry. No rash noted. She is not  diaphoretic. No erythema.  Nursing note and vitals reviewed.   ED Course  Procedures (including critical care time) Labs Review Labs Reviewed  CBC WITH DIFFERENTIAL/PLATELET - Abnormal; Notable for the following:    RBC 2.13 (*)    Hemoglobin 6.4 (*)    HCT 20.1 (*)    RDW 16.0 (*)    All other components within normal limits  COMPREHENSIVE METABOLIC PANEL - Abnormal; Notable for the following:    Sodium 132 (*)    Glucose, Bld 114 (*)    BUN 41 (*)    Creatinine, Ser 1.72 (*)    Total Protein 6.4 (*)    ALT 13 (*)    Total Bilirubin 0.2 (*)    GFR calc non Af Amer 25 (*)    GFR calc Af Amer 29 (*)    All other components within normal limits  CBC - Abnormal; Notable for the following:    RBC 2.61 (*)    Hemoglobin 7.6 (*)    HCT 23.6 (*)    RDW 17.7 (*)    All other components within normal limits  TYPE AND SCREEN  PREPARE RBC (CROSSMATCH)    Imaging Review Dg Chest 2 View  12/11/2015  CLINICAL DATA:  Weakness, nausea. EXAM: CHEST  2 VIEW COMPARISON:  Dec 04, 2014. FINDINGS: The heart size and mediastinal contours are within normal limits. Both lungs are clear. No pneumothorax or pleural effusion is noted. The visualized skeletal structures are unremarkable. IMPRESSION: No active cardiopulmonary disease. Electronically Signed   By: Marijo Conception, M.D.   On: 12/11/2015 18:07   I have personally reviewed and evaluated these images and lab results as part of my medical decision-making.   EKG Interpretation   Date/Time:  Monday Dec 11 2015 23:18:20 EDT Ventricular Rate:  81 PR Interval:  222 QRS Duration: 83 QT Interval:  379 QTC Calculation: 440 R Axis:   52 Text Interpretation:  Sinus rhythm Prolonged PR interval Probable  anteroseptal infarct, old Baseline wander in lead(s) V6 No significant  change since last tracing Confirmed by [facility] MD, Smith ([id number]) on  12/12/2015 2:12:19 AM      MDM   Final diagnoses:  Symptomatic anemia  Nausea without  vomiting    82 year old female with a history of transitional cell carcinoma, post surgery in 2015 in [place], history of pulmonary hemorrhage, hypertension, hyperlipidemia, CKD stage III, prior admission for concern of anemia one year ago, at which time CT showed gastritis, however patient declined rectal exam and GI evaluation, presents from urgent care for concern of symptomatic anemia with a hemoglobin of 6 after patient presented with generalized weakness, fatigue, shortness of breath and nausea. X-ray obtained that urgent care shows no acute findings. EKG without acute changes.  Hemoglobin 6.4. Patient again declines rectal exam, or GI consult if there is concern for GI bleeding. By history, it is unclear what the etiology of patient's anemia is. She has chronic black stool, with no change, no history of vaginal bleeding, no history of  hematuria. Discussed with patient that she has symptomatic anemia, and recommend blood transfusion, rectal exam, admission for further evaluation. Initially was thought patient had consented to transfusion and admission, however on further discussion, patient declines admission. Discussed the patient in detail risks of leaving [place], and unclear setting of anemia, possible acute bleed which may result in death or disability. Patient understands these risks, however would like to go home. Repeated hemoglobin after 1 unit of PRBC transfusion, which showed increased to 7.6 and pt reports symptomatic improvement.  Patient with very mild acute on chronic kidney injury with a creatinine of 1.7 from 1.4. Recommend continued close follow-up with primary care physician as soon as possible, with repeat laboratory values. Patient left AGAINST MEDICAL ADVICE, however will follow up with PCP and return if symptoms worsen.   Gareth Morgan, MD 12/12/15 [place] [place]

## 2015-12-11 NOTE — ED Provider Notes (Addendum)
CSN: [id number]     Arrival date & time 12/11/15  1623 History   First MD Initiated Contact with Patient 12/11/15 1701     Chief Complaint  Patient presents with  . Weakness   (Consider location/radiation/quality/duration/timing/severity/associated sxs/prior Treatment) Patient is a 82 y.o. female presenting with weakness. The history is provided by the patient and a relative.  Weakness This is a recurrent problem. The current episode started more than 1 week ago. The problem has been gradually worsening. Associated symptoms include shortness of breath. Pertinent negatives include no chest pain and no abdominal pain.    Past Medical History  Diagnosis Date  . Pulmonary hemorrhage 2003  . Allergy   . Bronchospasm   . Shingles   . Hypertension   . HLD (hyperlipidemia)    History reviewed. No pertinent past surgical history. Family History  Problem Relation Age of Onset  . Stroke Father   . Lung cancer Brother    Social History  Substance Use Topics  . Smoking status: Former Research scientist (life sciences)  . Smokeless tobacco: Never Used  . Alcohol Use: No   OB History    No data available     Review of Systems  Constitutional: Negative for fever and appetite change.  HENT: Negative.   Respiratory: Positive for shortness of breath. Negative for wheezing.   Cardiovascular: Negative.  Negative for chest pain.  Gastrointestinal: Positive for nausea. Negative for abdominal pain.  Neurological: Positive for weakness.  All other systems reviewed and are negative.   Allergies  Asa arthritis strength-antacid; Ivp dye; and Penicillins  Home Medications   Prior to Admission medications   Medication Sig Start Date End Date Taking? Authorizing Provider  acetaminophen (TYLENOL) 325 MG tablet Take 650 mg by mouth every 6 (six) hours as needed (pain).    Historical Provider, MD  albuterol (PROVENTIL HFA;VENTOLIN HFA) 108 (90 BASE) MCG/ACT inhaler Inhale 2 puffs into the lungs every 6 (six) hours as  needed for wheezing or shortness of breath. 09/24/13   Orma Flaming, MD  atorvastatin (LIPITOR) 20 MG tablet Take 20 mg by mouth at bedtime.     Historical Provider, MD  irbesartan (AVAPRO) 300 MG tablet Take 300 mg by mouth at bedtime.    Historical Provider, MD  pantoprazole (PROTONIX) 40 MG tablet Take 1 tablet (40 mg total) by mouth daily. 12/06/14   Robbie Lis, MD  timolol (TIMOPTIC) 0.5 % ophthalmic solution Place 1 drop into both eyes 2 (two) times daily.  11/21/14   Historical Provider, MD  tretinoin (RETIN-A) 0.1 % cream Apply 1 application topically at bedtime as needed (skin care).     Historical Provider, MD   Meds Ordered and Administered this Visit  Medications - No data to display  BP 152/71 mmHg  Pulse 86  Temp(Src) 98.1 F (36.7 C) (Oral)  Resp 16  SpO2 96% No data found.   Physical Exam  Constitutional: She is oriented to person, place, and time. She appears well-developed and well-nourished. No distress.  HENT:  Right Ear: External ear normal.  Left Ear: External ear normal.  Mouth/Throat: Oropharynx is clear and moist.  Neck: Normal range of motion. Neck supple.  Cardiovascular: Normal rate, regular rhythm, normal heart sounds and intact distal pulses.   Pulmonary/Chest: Effort normal and breath sounds normal.  Abdominal: Soft. Bowel sounds are normal. There is no tenderness.  Musculoskeletal: She exhibits no edema.  Neurological: She is alert and oriented to person, place, and time. No cranial nerve deficit.  Skin: Skin is warm and dry.  Nursing note and vitals reviewed.   ED Course  Procedures (including critical care time)  Labs Review Labs Reviewed  POCT I-STAT, CHEM 8 - Abnormal; Notable for the following:    BUN 42 (*)    Creatinine, Ser 1.60 (*)    Glucose, Bld 117 (*)    Hemoglobin 6.1 (*)    HCT 18.0 (*)    All other components within normal limits   hgb 6.1 Imaging Review No results found.   Visual Acuity Review  Right Eye  Distance:   Left Eye Distance:   Bilateral Distance:    Right Eye Near:   Left Eye Near:    Bilateral Near:         MDM   1. Anemia associated with chronic renal failure, stage 2 (mild)    Sent for eval of severe anemia 6.1 with weakness. H/o similar problem. Borderline renal failure.    Billy Fischer, MD 12/11/15 [id number]  Billy Fischer, MD 12/11/15 1739

## 2015-12-11 NOTE — ED Notes (Signed)
Pt has  A  History  Of  Anemia       Gets   Short     Of  Breath         On  Exertion           Feels nauseated         No  Pain     Symptoms  Began  About  5  Days  ago

## 2015-12-12 DIAGNOSIS — R319 Hematuria, unspecified: Secondary | ICD-10-CM | POA: Diagnosis not present

## 2015-12-12 DIAGNOSIS — D500 Iron deficiency anemia secondary to blood loss (chronic): Secondary | ICD-10-CM | POA: Diagnosis not present

## 2015-12-12 LAB — CBC
HCT: 23.6 % — ABNORMAL LOW (ref 36.0–46.0)
Hemoglobin: 7.6 g/dL — ABNORMAL LOW (ref 12.0–15.0)
MCH: 29.1 pg (ref 26.0–34.0)
MCHC: 32.2 g/dL (ref 30.0–36.0)
MCV: 90.4 fL (ref 78.0–100.0)
PLATELETS: 282 10*3/uL (ref 150–400)
RBC: 2.61 MIL/uL — ABNORMAL LOW (ref 3.87–5.11)
RDW: 17.7 % — AB (ref 11.5–15.5)
WBC: 6.5 10*3/uL (ref 4.0–10.5)

## 2015-12-12 LAB — TYPE AND SCREEN
ABO/RH(D): B POS
ANTIBODY SCREEN: NEGATIVE
UNIT DIVISION: 0

## 2015-12-12 NOTE — Discharge Instructions (Signed)
Blood Transfusion, Care After °Refer to this sheet in the next few weeks. These instructions provide you with information about caring for yourself after your procedure. Your health care provider may also give you more specific instructions. Your treatment has been planned according to current medical practices, but problems sometimes occur. Call your health care provider if you have any problems or questions after your procedure. °WHAT TO EXPECT AFTER THE PROCEDURE °After your procedure, it is common to have: °· Bruising and soreness at the IV site. °· Chills or fever. °· Headache. °HOME CARE INSTRUCTIONS °· Take medicines only as directed by your health care provider. Ask your health care provider if you can take an over-the-counter pain reliever in case you have a fever or headache a day or two after your transfusion. °· Return to your normal activities as directed by your health care provider. °SEEK MEDICAL CARE IF:  °· You develop redness or irritation at your IV site. °· You have persistent fever, chills, or headache. °· Your urine is darker than normal. °· Your urine turns pink, red, or brown.   °· The white part of your eye turns yellow (jaundice).   °· You feel weak after doing your normal activities.   °SEEK IMMEDIATE MEDICAL CARE IF:  °· You have trouble breathing. °· You have fever and chills along with: °· Anxiety. °· Chest or back pain. °· Flushed skin. °· Clammy skin. °· A rapid heartbeat. °· Nausea. °  °This information is not intended to replace advice given to you by your health care provider. Make sure you discuss any questions you have with your health care provider. °  °Document Released: 07/22/2014 Document Reviewed: 07/22/2014 °Elsevier Interactive Patient Education ©2016 [employer] ° °Anemia, Nonspecific °Anemia is a condition in which the concentration of red blood cells or hemoglobin in the blood is below normal. Hemoglobin is a substance in red blood cells that carries oxygen to the  tissues of the body. Anemia results in not enough oxygen reaching these tissues.  °CAUSES  °Common causes of anemia include:  °· Excessive bleeding. Bleeding may be internal or external. This includes excessive bleeding from periods (in women) or from the intestine.   °· Poor nutrition.   °· Chronic kidney, thyroid, and liver disease.  °· Bone marrow disorders that decrease red blood cell production. °· Cancer and treatments for cancer. °· HIV, AIDS, and their treatments. °· Spleen problems that increase red blood cell destruction. °· Blood disorders. °· Excess destruction of red blood cells due to infection, medicines, and autoimmune disorders. °SIGNS AND SYMPTOMS  °· Minor weakness.   °· Dizziness.   °· Headache. °· Palpitations.   °· Shortness of breath, especially with exercise.   °· Paleness. °· Cold sensitivity. °· Indigestion. °· Nausea. °· Difficulty sleeping. °· Difficulty concentrating. °Symptoms may occur suddenly or they may develop slowly.  °DIAGNOSIS  °Additional blood tests are often needed. These help your health care provider determine the best treatment. Your health care provider will check your stool for blood and look for other causes of blood loss.  °TREATMENT  °Treatment varies depending on the cause of the anemia. Treatment can include:  °· Supplements of iron, vitamin B12, or folic acid.   °· Hormone medicines.   °· A blood transfusion. This may be needed if blood loss is severe.   °· Hospitalization. This may be needed if there is significant continual blood loss.   °· Dietary changes. °· Spleen removal. °HOME CARE INSTRUCTIONS °Keep all follow-up appointments. It often takes many weeks to correct anemia, and having your   health care provider check on your condition and your response to treatment is very important. °SEEK IMMEDIATE MEDICAL CARE IF:  °· You develop extreme weakness, shortness of breath, or chest pain.   °· You become dizzy or have trouble concentrating. °· You develop heavy  vaginal bleeding.   °· You develop a rash.   °· You have bloody or black, tarry stools.   °· You faint.   °· You vomit up blood.   °· You vomit repeatedly.   °· You have abdominal pain. °· You have a fever or persistent symptoms for more than 2-3 days.   °· You have a fever and your symptoms suddenly get worse.   °· You are dehydrated.   °MAKE SURE YOU: °· Understand these instructions. °· Will watch your condition. °· Will get help right away if you are not doing well or get worse. °  °This information is not intended to replace advice given to you by your health care provider. Make sure you discuss any questions you have with your health care provider. °  °Document Released: 08/08/2004 Document Revised: 03/03/2013 Document Reviewed: 12/25/2012 °Elsevier Interactive Patient Education ©2016 Elsevier Inc. ° °

## 2015-12-18 ENCOUNTER — Ambulatory Visit: Payer: Medicare Other

## 2015-12-18 ENCOUNTER — Encounter: Payer: Self-pay | Admitting: Podiatry

## 2015-12-18 ENCOUNTER — Ambulatory Visit (INDEPENDENT_AMBULATORY_CARE_PROVIDER_SITE_OTHER): Payer: Medicare Other | Admitting: Podiatry

## 2015-12-18 VITALS — BP 138/62 | HR 66 | Resp 16 | Ht 62.0 in | Wt 175.0 lb

## 2015-12-18 DIAGNOSIS — M25571 Pain in right ankle and joints of right foot: Secondary | ICD-10-CM

## 2015-12-18 DIAGNOSIS — M779 Enthesopathy, unspecified: Secondary | ICD-10-CM

## 2015-12-18 MED ORDER — TRIAMCINOLONE ACETONIDE 10 MG/ML IJ SUSP: 10.0000 mg | Freq: Once | INTRAMUSCULAR | Status: AC

## 2015-12-18 NOTE — Progress Notes (Signed)
   Subjective:    Patient ID: [name], female    DOB: 12-21-1923, 82 y.o.   MRN: [id number]  HPI Chief Complaint  Patient presents with  . Ankle Pain    Right foot; lateral side; x2 months      Review of Systems  All other systems reviewed and are negative.      Objective:   Physical Exam        Assessment & Plan:

## 2015-12-19 DIAGNOSIS — D649 Anemia, unspecified: Secondary | ICD-10-CM | POA: Diagnosis not present

## 2015-12-19 DIAGNOSIS — D500 Iron deficiency anemia secondary to blood loss (chronic): Secondary | ICD-10-CM | POA: Diagnosis not present

## 2015-12-19 DIAGNOSIS — R319 Hematuria, unspecified: Secondary | ICD-10-CM | POA: Diagnosis not present

## 2015-12-20 NOTE — Progress Notes (Signed)
Subjective:     Patient ID: [name], female   DOB: Nov 26, 1923, 82 y.o.   MRN: [id number]  HPI patient presents stating she has some swelling on the side of her right foot and she has pain in the right ankle of 2 months duration. Does not remember specific injury   Review of Systems  All other systems reviewed and are negative.      Objective:   Physical Exam  Constitutional: She is oriented to person, place, and time.  Cardiovascular: Intact distal pulses.   Musculoskeletal: Normal range of motion.  Neurological: She is oriented to person, place, and time.  Skin: Skin is warm.  Nursing note and vitals reviewed.  Neurovascular status intact muscle strength adequate range of motion within normal limits with patient found to have pain in the peroneal tendon group along with mild swelling of the fibula itself with no history of significant injury. It is tender mostly around the peroneal complex and slightly in a more proximal direction. Patient's found have good digital perfusion is well oriented 3 and did [place] reduction of motion around the joint secondary to splinting     Assessment:     Probable some form of inflammatory condition or possible sprained she was not aware of with no indication currently of fracture    Plan:     H&P and x-rays reviewed. Today I did do a careful peroneal sheath injection 3 mg Kenalog 5 mg Xylocaine and applied a compression stocking to try to reduce swelling. Reappoint in the next several weeks or as needed  X-ray report indicated no indications of diastases injury or fracture at this time

## 2016-01-01 DIAGNOSIS — C651 Malignant neoplasm of right renal pelvis: Secondary | ICD-10-CM | POA: Diagnosis not present

## 2016-01-01 DIAGNOSIS — N3289 Other specified disorders of bladder: Secondary | ICD-10-CM | POA: Diagnosis not present

## 2016-01-17 DIAGNOSIS — D649 Anemia, unspecified: Secondary | ICD-10-CM | POA: Diagnosis not present

## 2016-01-24 DIAGNOSIS — C641 Malignant neoplasm of right kidney, except renal pelvis: Secondary | ICD-10-CM | POA: Diagnosis not present

## 2016-01-24 DIAGNOSIS — C651 Malignant neoplasm of right renal pelvis: Secondary | ICD-10-CM | POA: Diagnosis not present

## 2016-01-26 DIAGNOSIS — C801 Malignant (primary) neoplasm, unspecified: Secondary | ICD-10-CM | POA: Diagnosis not present

## 2016-01-26 DIAGNOSIS — E785 Hyperlipidemia, unspecified: Secondary | ICD-10-CM | POA: Diagnosis not present

## 2016-01-26 DIAGNOSIS — N183 Chronic kidney disease, stage 3 (moderate): Secondary | ICD-10-CM | POA: Diagnosis not present

## 2016-01-26 DIAGNOSIS — R3129 Other microscopic hematuria: Secondary | ICD-10-CM | POA: Diagnosis not present

## 2016-01-26 DIAGNOSIS — M5030 Other cervical disc degeneration, unspecified cervical region: Secondary | ICD-10-CM | POA: Diagnosis not present

## 2016-01-26 DIAGNOSIS — H359 Unspecified retinal disorder: Secondary | ICD-10-CM | POA: Diagnosis not present

## 2016-01-26 DIAGNOSIS — D649 Anemia, unspecified: Secondary | ICD-10-CM | POA: Diagnosis not present

## 2016-01-26 DIAGNOSIS — E538 Deficiency of other specified B group vitamins: Secondary | ICD-10-CM | POA: Diagnosis not present

## 2016-01-26 DIAGNOSIS — I10 Essential (primary) hypertension: Secondary | ICD-10-CM | POA: Diagnosis not present

## 2016-01-30 DIAGNOSIS — C651 Malignant neoplasm of right renal pelvis: Secondary | ICD-10-CM | POA: Diagnosis not present

## 2016-02-02 DIAGNOSIS — C649 Malignant neoplasm of unspecified kidney, except renal pelvis: Secondary | ICD-10-CM | POA: Diagnosis not present

## 2016-02-02 DIAGNOSIS — N1330 Unspecified hydronephrosis: Secondary | ICD-10-CM | POA: Diagnosis not present

## 2016-02-06 DIAGNOSIS — N189 Chronic kidney disease, unspecified: Secondary | ICD-10-CM | POA: Diagnosis not present

## 2016-02-06 DIAGNOSIS — N183 Chronic kidney disease, stage 3 (moderate): Secondary | ICD-10-CM | POA: Diagnosis not present

## 2016-02-06 DIAGNOSIS — C651 Malignant neoplasm of right renal pelvis: Secondary | ICD-10-CM | POA: Diagnosis not present

## 2016-02-09 DIAGNOSIS — C801 Malignant (primary) neoplasm, unspecified: Secondary | ICD-10-CM | POA: Diagnosis not present

## 2016-02-09 DIAGNOSIS — E538 Deficiency of other specified B group vitamins: Secondary | ICD-10-CM | POA: Diagnosis not present

## 2016-02-09 DIAGNOSIS — R05 Cough: Secondary | ICD-10-CM | POA: Diagnosis not present

## 2016-02-09 DIAGNOSIS — N183 Chronic kidney disease, stage 3 (moderate): Secondary | ICD-10-CM | POA: Diagnosis not present

## 2016-02-22 DIAGNOSIS — R010 Benign and innocent cardiac murmurs: Secondary | ICD-10-CM | POA: Diagnosis not present

## 2016-02-22 DIAGNOSIS — I351 Nonrheumatic aortic (valve) insufficiency: Secondary | ICD-10-CM | POA: Diagnosis not present

## 2016-02-22 DIAGNOSIS — R011 Cardiac murmur, unspecified: Secondary | ICD-10-CM | POA: Diagnosis not present

## 2016-02-27 DIAGNOSIS — C651 Malignant neoplasm of right renal pelvis: Secondary | ICD-10-CM | POA: Diagnosis not present

## 2016-02-28 DIAGNOSIS — E875 Hyperkalemia: Secondary | ICD-10-CM | POA: Diagnosis not present

## 2016-03-14 DIAGNOSIS — K31811 Angiodysplasia of stomach and duodenum with bleeding: Secondary | ICD-10-CM | POA: Diagnosis not present

## 2016-03-14 DIAGNOSIS — N179 Acute kidney failure, unspecified: Secondary | ICD-10-CM | POA: Diagnosis not present

## 2016-03-14 DIAGNOSIS — R112 Nausea with vomiting, unspecified: Secondary | ICD-10-CM | POA: Diagnosis not present

## 2016-03-14 DIAGNOSIS — K2970 Gastritis, unspecified, without bleeding: Secondary | ICD-10-CM | POA: Diagnosis present

## 2016-03-14 DIAGNOSIS — N189 Chronic kidney disease, unspecified: Secondary | ICD-10-CM | POA: Diagnosis present

## 2016-03-14 DIAGNOSIS — E785 Hyperlipidemia, unspecified: Secondary | ICD-10-CM | POA: Diagnosis not present

## 2016-03-14 DIAGNOSIS — Z87891 Personal history of nicotine dependence: Secondary | ICD-10-CM | POA: Diagnosis not present

## 2016-03-14 DIAGNOSIS — K2950 Unspecified chronic gastritis without bleeding: Secondary | ICD-10-CM | POA: Diagnosis not present

## 2016-03-14 DIAGNOSIS — I129 Hypertensive chronic kidney disease with stage 1 through stage 4 chronic kidney disease, or unspecified chronic kidney disease: Secondary | ICD-10-CM | POA: Diagnosis present

## 2016-03-14 DIAGNOSIS — Z85528 Personal history of other malignant neoplasm of kidney: Secondary | ICD-10-CM | POA: Diagnosis not present

## 2016-03-14 DIAGNOSIS — R944 Abnormal results of kidney function studies: Secondary | ICD-10-CM | POA: Diagnosis not present

## 2016-03-14 DIAGNOSIS — D500 Iron deficiency anemia secondary to blood loss (chronic): Secondary | ICD-10-CM | POA: Diagnosis not present

## 2016-03-14 DIAGNOSIS — K922 Gastrointestinal hemorrhage, unspecified: Secondary | ICD-10-CM | POA: Diagnosis not present

## 2016-03-14 DIAGNOSIS — D62 Acute posthemorrhagic anemia: Secondary | ICD-10-CM | POA: Diagnosis present

## 2016-03-14 DIAGNOSIS — R809 Proteinuria, unspecified: Secondary | ICD-10-CM | POA: Diagnosis not present

## 2016-03-14 DIAGNOSIS — K2960 Other gastritis without bleeding: Secondary | ICD-10-CM | POA: Diagnosis not present

## 2016-03-14 DIAGNOSIS — K209 Esophagitis, unspecified: Secondary | ICD-10-CM | POA: Diagnosis present

## 2016-03-15 DIAGNOSIS — R809 Proteinuria, unspecified: Secondary | ICD-10-CM | POA: Diagnosis not present

## 2016-03-15 DIAGNOSIS — K922 Gastrointestinal hemorrhage, unspecified: Secondary | ICD-10-CM | POA: Diagnosis not present

## 2016-03-15 DIAGNOSIS — K2950 Unspecified chronic gastritis without bleeding: Secondary | ICD-10-CM | POA: Diagnosis not present

## 2016-03-20 DIAGNOSIS — D62 Acute posthemorrhagic anemia: Secondary | ICD-10-CM | POA: Diagnosis not present

## 2016-03-20 DIAGNOSIS — C651 Malignant neoplasm of right renal pelvis: Secondary | ICD-10-CM | POA: Diagnosis not present

## 2016-03-20 DIAGNOSIS — Z23 Encounter for immunization: Secondary | ICD-10-CM | POA: Diagnosis not present

## 2016-03-20 DIAGNOSIS — D500 Iron deficiency anemia secondary to blood loss (chronic): Secondary | ICD-10-CM | POA: Diagnosis not present

## 2016-03-20 DIAGNOSIS — I10 Essential (primary) hypertension: Secondary | ICD-10-CM | POA: Diagnosis not present

## 2016-03-22 DIAGNOSIS — D649 Anemia, unspecified: Secondary | ICD-10-CM | POA: Diagnosis not present

## 2016-03-29 DIAGNOSIS — D509 Iron deficiency anemia, unspecified: Secondary | ICD-10-CM | POA: Diagnosis not present

## 2016-04-01 DIAGNOSIS — C651 Malignant neoplasm of right renal pelvis: Secondary | ICD-10-CM | POA: Diagnosis not present

## 2016-04-16 DIAGNOSIS — D649 Anemia, unspecified: Secondary | ICD-10-CM | POA: Diagnosis not present

## 2016-04-16 DIAGNOSIS — C801 Malignant (primary) neoplasm, unspecified: Secondary | ICD-10-CM | POA: Diagnosis not present

## 2016-04-16 DIAGNOSIS — E785 Hyperlipidemia, unspecified: Secondary | ICD-10-CM | POA: Diagnosis not present

## 2016-04-16 DIAGNOSIS — C651 Malignant neoplasm of right renal pelvis: Secondary | ICD-10-CM | POA: Diagnosis not present

## 2016-04-16 DIAGNOSIS — D500 Iron deficiency anemia secondary to blood loss (chronic): Secondary | ICD-10-CM | POA: Diagnosis not present

## 2016-04-16 DIAGNOSIS — I10 Essential (primary) hypertension: Secondary | ICD-10-CM | POA: Diagnosis not present

## 2016-04-16 DIAGNOSIS — R3129 Other microscopic hematuria: Secondary | ICD-10-CM | POA: Diagnosis not present

## 2016-04-16 DIAGNOSIS — Z0001 Encounter for general adult medical examination with abnormal findings: Secondary | ICD-10-CM | POA: Diagnosis not present

## 2016-04-23 DIAGNOSIS — E785 Hyperlipidemia, unspecified: Secondary | ICD-10-CM | POA: Diagnosis not present

## 2016-04-23 DIAGNOSIS — I10 Essential (primary) hypertension: Secondary | ICD-10-CM | POA: Diagnosis not present

## 2016-04-23 DIAGNOSIS — C651 Malignant neoplasm of right renal pelvis: Secondary | ICD-10-CM | POA: Diagnosis not present

## 2016-05-02 DIAGNOSIS — Z961 Presence of intraocular lens: Secondary | ICD-10-CM | POA: Diagnosis not present

## 2016-05-02 DIAGNOSIS — H353132 Nonexudative age-related macular degeneration, bilateral, intermediate dry stage: Secondary | ICD-10-CM | POA: Diagnosis not present

## 2016-05-02 DIAGNOSIS — H2510 Age-related nuclear cataract, unspecified eye: Secondary | ICD-10-CM | POA: Diagnosis not present

## 2016-05-02 DIAGNOSIS — H348112 Central retinal vein occlusion, right eye, stable: Secondary | ICD-10-CM | POA: Diagnosis not present

## 2016-05-02 DIAGNOSIS — H35372 Puckering of macula, left eye: Secondary | ICD-10-CM | POA: Diagnosis not present

## 2016-05-02 DIAGNOSIS — H353190 Nonexudative age-related macular degeneration, unspecified eye, stage unspecified: Secondary | ICD-10-CM | POA: Diagnosis not present

## 2016-05-07 DIAGNOSIS — I10 Essential (primary) hypertension: Secondary | ICD-10-CM | POA: Diagnosis not present

## 2016-05-07 DIAGNOSIS — D500 Iron deficiency anemia secondary to blood loss (chronic): Secondary | ICD-10-CM | POA: Diagnosis not present

## 2016-05-07 DIAGNOSIS — I6529 Occlusion and stenosis of unspecified carotid artery: Secondary | ICD-10-CM | POA: Diagnosis not present

## 2016-05-07 DIAGNOSIS — N183 Chronic kidney disease, stage 3 (moderate): Secondary | ICD-10-CM | POA: Diagnosis not present

## 2016-05-07 DIAGNOSIS — C641 Malignant neoplasm of right kidney, except renal pelvis: Secondary | ICD-10-CM | POA: Diagnosis not present

## 2016-05-14 DIAGNOSIS — N390 Urinary tract infection, site not specified: Secondary | ICD-10-CM | POA: Diagnosis not present

## 2016-05-14 DIAGNOSIS — I10 Essential (primary) hypertension: Secondary | ICD-10-CM | POA: Diagnosis not present

## 2016-05-14 DIAGNOSIS — R319 Hematuria, unspecified: Secondary | ICD-10-CM | POA: Diagnosis not present

## 2016-05-14 DIAGNOSIS — E782 Mixed hyperlipidemia: Secondary | ICD-10-CM | POA: Diagnosis not present

## 2016-05-14 DIAGNOSIS — C641 Malignant neoplasm of right kidney, except renal pelvis: Secondary | ICD-10-CM | POA: Diagnosis not present

## 2016-05-24 DIAGNOSIS — D49511 Neoplasm of unspecified behavior of right kidney: Secondary | ICD-10-CM | POA: Diagnosis not present

## 2016-06-18 DIAGNOSIS — N183 Chronic kidney disease, stage 3 (moderate): Secondary | ICD-10-CM | POA: Diagnosis not present

## 2016-06-18 DIAGNOSIS — E782 Mixed hyperlipidemia: Secondary | ICD-10-CM | POA: Diagnosis not present

## 2016-06-18 DIAGNOSIS — I10 Essential (primary) hypertension: Secondary | ICD-10-CM | POA: Diagnosis not present

## 2016-06-18 DIAGNOSIS — C651 Malignant neoplasm of right renal pelvis: Secondary | ICD-10-CM | POA: Diagnosis not present

## 2016-08-13 DIAGNOSIS — J22 Unspecified acute lower respiratory infection: Secondary | ICD-10-CM | POA: Diagnosis not present

## 2016-08-13 DIAGNOSIS — J111 Influenza due to unidentified influenza virus with other respiratory manifestations: Secondary | ICD-10-CM | POA: Diagnosis not present

## 2016-08-13 DIAGNOSIS — J069 Acute upper respiratory infection, unspecified: Secondary | ICD-10-CM | POA: Diagnosis not present

## 2016-08-20 DIAGNOSIS — D49511 Neoplasm of unspecified behavior of right kidney: Secondary | ICD-10-CM | POA: Diagnosis not present

## 2016-08-21 DIAGNOSIS — J111 Influenza due to unidentified influenza virus with other respiratory manifestations: Secondary | ICD-10-CM | POA: Diagnosis not present

## 2016-08-27 DIAGNOSIS — C641 Malignant neoplasm of right kidney, except renal pelvis: Secondary | ICD-10-CM | POA: Diagnosis not present

## 2016-08-27 DIAGNOSIS — D49511 Neoplasm of unspecified behavior of right kidney: Secondary | ICD-10-CM | POA: Diagnosis not present

## 2016-09-17 DIAGNOSIS — J439 Emphysema, unspecified: Secondary | ICD-10-CM | POA: Diagnosis not present

## 2016-09-17 DIAGNOSIS — R918 Other nonspecific abnormal finding of lung field: Secondary | ICD-10-CM | POA: Diagnosis not present

## 2016-09-17 DIAGNOSIS — C661 Malignant neoplasm of right ureter: Secondary | ICD-10-CM | POA: Diagnosis not present

## 2016-09-17 DIAGNOSIS — R911 Solitary pulmonary nodule: Secondary | ICD-10-CM | POA: Diagnosis not present

## 2016-09-23 ENCOUNTER — Telehealth: Payer: Self-pay | Admitting: Oncology

## 2016-09-23 ENCOUNTER — Encounter: Payer: Self-pay | Admitting: Oncology

## 2016-09-23 NOTE — Telephone Encounter (Signed)
Appt has been scheduled for the pt to see Dr. Alen Blew on 3/22 at 11am. Pt aware to arrive 30 minutes early. Letter mailed and referring notified.

## 2016-10-03 ENCOUNTER — Telehealth: Payer: Self-pay | Admitting: Oncology

## 2016-10-03 ENCOUNTER — Ambulatory Visit (HOSPITAL_BASED_OUTPATIENT_CLINIC_OR_DEPARTMENT_OTHER): Payer: Medicare Other | Admitting: Oncology

## 2016-10-03 VITALS — BP 149/63 | HR 68 | Temp 97.8°F | Resp 18 | Wt 175.5 lb

## 2016-10-03 DIAGNOSIS — D500 Iron deficiency anemia secondary to blood loss (chronic): Secondary | ICD-10-CM

## 2016-10-03 DIAGNOSIS — R319 Hematuria, unspecified: Secondary | ICD-10-CM

## 2016-10-03 DIAGNOSIS — Z8553 Personal history of malignant neoplasm of renal pelvis: Secondary | ICD-10-CM | POA: Diagnosis not present

## 2016-10-03 DIAGNOSIS — R918 Other nonspecific abnormal finding of lung field: Secondary | ICD-10-CM

## 2016-10-03 DIAGNOSIS — D49519 Neoplasm of unspecified behavior of unspecified kidney: Secondary | ICD-10-CM

## 2016-10-03 DIAGNOSIS — N1330 Unspecified hydronephrosis: Secondary | ICD-10-CM

## 2016-10-03 NOTE — Progress Notes (Signed)
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   HPI: [name] is a pleasant 83 year old woman currently of [place] where she lived the last 38 years. She is originally from [place] and drives their periodically in the summer. She was noted to have a low-grade upper genitourinary tract tumor of the right side in 2015. She was also found to have a right hydronephrosis and recommended nephroureterectomy. She declined this treatment at that time. Most recently she was referred to Dr. Pilar Jarvis after developing symptoms of hematuria in 2017. In February 2017 she had a CT scan of the abdomen and pelvis which showed hydronephrosis related to a mass in the inferior pole of the right kidney which were some for neoplasm. A repeat CT scan obtained in February 2018 showed progressive right hydronephrosis with several new bilateral lung nodules noted at that time. CT scan of the chest obtained on 09/17/2016 showed multifocal pulmonary nodules compatible with metastatic disease. The index nodule within the right lower lobe measures 1.5 cm which is new compared to previous CT scans. The left upper pole index lesion measuring 6 mm. There is also a 7 mm nodule also noted. Patient referred to me for evaluation regarding these findings, clinically, she is completely asymptomatic at this time. She denied any hematuria, pelvic pain or flank pain. She denied any hematuria or dysuria. She continues to live independently and attends activities of daily living. She remains active and exercises regularly.  She does not report any headaches or blurry vision, syncope or seizures. She does not report any fevers, chills, sweats or weight loss. She does not report any chest pain, palpitation, orthopnea or leg edema. She does not report any cough, wheezing or hemoptysis. She does not report any nausea, vomiting or abdominal pain. She is not report any frequency urgency or hesitancy. She does not report any  skeletal complaints. Remaining review of systems unremarkable.    Past Medical History:  Diagnosis Date  . Allergy   . Bronchospasm   . HLD (hyperlipidemia)   . Hypertension   . Pulmonary hemorrhage 2003  . Shingles   :  No past surgical history on file.:   Current Outpatient Prescriptions:  .  acetaminophen (TYLENOL) 325 MG tablet, Take 650 mg by mouth every 6 (six) hours as needed (pain)., Disp: , Rfl:  .  atorvastatin (LIPITOR) 20 MG tablet, Take 20 mg by mouth at bedtime. , Disp: , Rfl:  .  irbesartan (AVAPRO) 300 MG tablet, Take 300 mg by mouth at bedtime., Disp: , Rfl:  .  LUTEIN PO, Take 1 tablet by mouth every morning., Disp: , Rfl:  .  timolol (TIMOPTIC) 0.5 % ophthalmic solution, Place 1 drop into both eyes 2 (two) times daily. , Disp: , Rfl: 4 .  tretinoin (RETIN-A) 0.1 % cream, Apply 1 application topically at bedtime as needed (skin care). , Disp: , Rfl:   Current Facility-Administered Medications:  .  triamcinolone acetonide (KENALOG) 10 MG/ML injection 10 mg, 10 mg, Other, Once, Wallene Huh, DPM:  Allergies  Allergen Reactions  . Ivp Dye [Iodinated Diagnostic Agents] Rash  . Penicillins Rash    Has patient had a PCN reaction causing immediate rash, facial/tongue/throat swelling, SOB or lightheadedness with hypotension: Unknown, childhood reaction Has patient had a PCN reaction causing severe rash involving mucus membranes or skin necrosis: No Has patient had a PCN reaction that required hospitalization No Has patient had a PCN reaction occurring within the last 10 years: No If  all of the above answers are "NO", then may proceed with Cephalosporin use.   :  Family History  Problem Relation Age of Onset  . Stroke Father   . Lung cancer Brother   :  Social History   Social History  . Marital status: Widowed    Spouse name: N/A  . Number of children: N/A  . Years of education: N/A   Occupational History  . Not on file.   Social History Main Topics   . Smoking status: Former Research scientist (life sciences)  . Smokeless tobacco: Never Used  . Alcohol use No  . Drug use: No  . Sexual activity: Not on file   Other Topics Concern  . Not on file   Social History Narrative  . No narrative on file  :  Pertinent items are noted in HPI.  Exam: Blood pressure (!) 149/63, pulse 68, temperature 97.8 F (36.6 C), temperature source Oral, resp. rate 18, weight 175 lb 8 oz (79.6 kg), SpO2 97 %. General appearance: alert and cooperative Nose: Nares normal. Septum midline. Mucosa normal. No drainage or sinus tenderness. Throat: lips, mucosa, and tongue normal; teeth and gums normal Neck: no adenopathy Back: negative Resp: clear to auscultation bilaterally Cardio: regular rate and rhythm, S1, S2 normal, no murmur, click, rub or gallop GI: soft, non-tender; bowel sounds normal; no masses,  no organomegaly Extremities: extremities normal, atraumatic, no cyanosis or edema Pulses: 2+ and symmetric  CBC    Component Value Date/Time   WBC 6.5 12/11/2015 2355   RBC 2.61 (L) 12/11/2015 2355   HGB 7.6 (L) 12/11/2015 2355   HCT 23.6 (L) 12/11/2015 2355   PLT 282 12/11/2015 2355   MCV 90.4 12/11/2015 2355   MCV 95.5 09/24/2013 1342   MCH 29.1 12/11/2015 2355   MCHC 32.2 12/11/2015 2355   RDW 17.7 (H) 12/11/2015 2355   LYMPHSABS 1.3 12/11/2015 1828   MONOABS 0.8 12/11/2015 1828   EOSABS 0.5 12/11/2015 1828   BASOSABS 0.0 12/11/2015 1828     Chemistry      Component Value Date/Time   NA 132 (L) 12/11/2015 1828   K 4.3 12/11/2015 1828   CL 102 12/11/2015 1828   CO2 24 12/11/2015 1828   BUN 41 (H) 12/11/2015 1828   CREATININE 1.72 (H) 12/11/2015 1828      Component Value Date/Time   CALCIUM 9.2 12/11/2015 1828   ALKPHOS 69 12/11/2015 1828   AST 15 12/11/2015 1828   ALT 13 (L) 12/11/2015 1828   BILITOT 0.2 (L) 12/11/2015 1828       Assessment and Plan:    83 year old woman with the following issues:  1. 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 He was followed by a urologist in [place] recently established care with Dr. Pilar Jarvis. She presented with hematuria in 2015-11-01 with a hemoglobin drifting back to 7.6. Her hematuria have resolved and her hemoglobin in October 2017 was normal.  Imaging studies obtained in February and 31-Oct-2016 showed progression of disease. She has enlarging hydronephrosis and pulmonary nodules. These findings to suggest advanced malignancy from a genitourinary tract. The natural course of this disease was discussed today with the patient including treatment options. Given the metastatic nature of her disease, primary surgical therapy is not an option. She is at risk of developing renal failure and that is the reason she declined her original operation.  Systemic therapy would be an option for  her with the benefit would be marginal. She is not a traditional systemic chemotherapy candidate but immunotherapy would be more tolerable for her if she elects to proceed with any treatment. Complications associated with Pembrolizumab as an example of immunotherapy was reviewed today. These complications include arthralgias, pruritus, hypothyroidism, fatigue and rarely symptoms of pneumonitis and colitis. She is still unclear whether she wants to proceed with such therapy and it is reasonable to withhold any treatment at this time. Alternatively, supportive care only would be an alternative approach. She understands that the cancer will probably go and will eventually cause her more symptoms and eventually death.  I recommended that she consider these options at this time and she will let me know in the near future. I will have her come back in a quick follow-up in the next couple months to follow on her clinical status.  2. Hydronephrosis: Nephrostomy tube can be placed to relieve any symptoms that she might develop in the future. Your kidney  function has been declining with these findings.   3. Anemia: Related to hematuria which appears to be improving at this time.  4. Follow-up: Will be in 2 months

## 2016-10-03 NOTE — Telephone Encounter (Signed)
Gave patient AVS and calender per 3/22 los. F/u in 2 months

## 2016-10-31 DIAGNOSIS — H35372 Puckering of macula, left eye: Secondary | ICD-10-CM | POA: Diagnosis not present

## 2016-10-31 DIAGNOSIS — H2511 Age-related nuclear cataract, right eye: Secondary | ICD-10-CM | POA: Diagnosis not present

## 2016-10-31 DIAGNOSIS — Z961 Presence of intraocular lens: Secondary | ICD-10-CM | POA: Diagnosis not present

## 2016-10-31 DIAGNOSIS — H348112 Central retinal vein occlusion, right eye, stable: Secondary | ICD-10-CM | POA: Diagnosis not present

## 2016-10-31 DIAGNOSIS — H353132 Nonexudative age-related macular degeneration, bilateral, intermediate dry stage: Secondary | ICD-10-CM | POA: Diagnosis not present

## 2016-11-05 ENCOUNTER — Telehealth: Payer: Self-pay | Admitting: Oncology

## 2016-11-05 DIAGNOSIS — N390 Urinary tract infection, site not specified: Secondary | ICD-10-CM | POA: Diagnosis not present

## 2016-11-05 DIAGNOSIS — D500 Iron deficiency anemia secondary to blood loss (chronic): Secondary | ICD-10-CM | POA: Diagnosis not present

## 2016-11-05 DIAGNOSIS — E782 Mixed hyperlipidemia: Secondary | ICD-10-CM | POA: Diagnosis not present

## 2016-11-05 DIAGNOSIS — R319 Hematuria, unspecified: Secondary | ICD-10-CM | POA: Diagnosis not present

## 2016-11-05 DIAGNOSIS — Z0000 Encounter for general adult medical examination without abnormal findings: Secondary | ICD-10-CM | POA: Diagnosis not present

## 2016-11-05 DIAGNOSIS — I10 Essential (primary) hypertension: Secondary | ICD-10-CM | POA: Diagnosis not present

## 2016-11-05 DIAGNOSIS — Z780 Asymptomatic menopausal state: Secondary | ICD-10-CM | POA: Diagnosis not present

## 2016-11-05 DIAGNOSIS — N183 Chronic kidney disease, stage 3 (moderate): Secondary | ICD-10-CM | POA: Diagnosis not present

## 2016-11-05 NOTE — Telephone Encounter (Signed)
Patient called to move her May appointment because she is going to [place] on 5/16 and won't be back till October.  I put her at 1:15 4/25 as that is the only appointments left until she leaves.

## 2016-11-06 ENCOUNTER — Telehealth: Payer: Self-pay | Admitting: Oncology

## 2016-11-06 ENCOUNTER — Ambulatory Visit (HOSPITAL_BASED_OUTPATIENT_CLINIC_OR_DEPARTMENT_OTHER): Payer: Medicare Other | Admitting: Oncology

## 2016-11-06 VITALS — BP 151/81 | HR 110 | Temp 97.9°F | Resp 18 | Ht 62.0 in | Wt 175.3 lb

## 2016-11-06 DIAGNOSIS — C642 Malignant neoplasm of left kidney, except renal pelvis: Secondary | ICD-10-CM | POA: Diagnosis not present

## 2016-11-06 DIAGNOSIS — Z7189 Other specified counseling: Secondary | ICD-10-CM

## 2016-11-06 DIAGNOSIS — R918 Other nonspecific abnormal finding of lung field: Secondary | ICD-10-CM

## 2016-11-06 DIAGNOSIS — C649 Malignant neoplasm of unspecified kidney, except renal pelvis: Secondary | ICD-10-CM

## 2016-11-06 NOTE — Progress Notes (Signed)
Hematology and Oncology Follow Up Visit  Laneice Meneely [id number] [birth year] 83 y.o. 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 She presented with hematuria and a tumor at the lower pole of the kidney diagnosed in 2015. She developed pulmonary nodules compatible with metastatic disease in March 2018.   Prior Therapy: She declined prior therapy including surgical resection.  Current therapy: Under evaluation for salvage therapy utilizing Pembrolizumab  Interim History: Ms. [name] presents today for a follow-up visit. She is a pleasant woman I saw in consultation in March 2018. 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 She is completely asymptomatic at this time and continues to report any recent symptoms. She does report intermittent hematuria but no other complaints. She denied any back pain or pathological fractures. Denied any shortness of breath or dyspnea and exertion. She continues to live independently and plans to travel to [place] where she spends the summer.  She does not report any headaches or blurry vision, syncope or seizures. She does not report any fevers, chills, sweats or weight loss. She does not report any chest pain, palpitation, orthopnea or leg edema. She does not report any cough, wheezing or hemoptysis. She does not report any nausea, vomiting or abdominal pain. She is not report any frequency urgency or hesitancy. She does not report any skeletal complaints. Remaining review of systems unremarkable.    Medications: I have reviewed the patient's current medications.  Current Outpatient Prescriptions  Medication Sig Dispense Refill  . acetaminophen (TYLENOL) 325 MG tablet Take 650 mg by mouth every 6 (six) hours as needed (pain).     Marland Kitchen amLODipine (NORVASC) 5 MG tablet Take 1 tablet by mouth daily.    Marland Kitchen atorvastatin (LIPITOR) 20 MG tablet Take 20 mg by mouth at bedtime.     . irbesartan (AVAPRO) 300 MG tablet Take 300 mg by mouth at bedtime.    . LUTEIN PO Take 1 tablet by mouth every morning.    . timolol (TIMOPTIC) 0.5 % ophthalmic solution Place 1 drop into both eyes 2 (two) times daily.   4  . tretinoin (RETIN-A) 0.1 % cream Apply 1 application topically at bedtime as needed (skin care).      Current Facility-Administered Medications  Medication Dose Route Frequency Provider Last Rate Last Dose  . triamcinolone acetonide (KENALOG) 10 MG/ML injection 10 mg  10 mg Other Once Wallene Huh, DPM         Allergies:  Allergies  Allergen Reactions  . Ivp Dye [Iodinated Diagnostic Agents] Rash  . Penicillins Rash    Has patient had a PCN reaction causing immediate rash, facial/tongue/throat swelling, SOB or lightheadedness with hypotension: Unknown, childhood reaction Has patient had a PCN reaction causing severe rash involving mucus membranes or skin necrosis: No Has patient had a PCN reaction that required hospitalization No Has patient had a PCN reaction occurring within the last 10 years: No If all of the above answers are "NO", then may proceed with Cephalosporin use.     Past Medical History, Surgical history, Social history, and Family History were reviewed and updated.  Physical Exam: Blood pressure (!) 151/81, pulse (!) 110, temperature 97.9 F (36.6 C), resp. rate 18, height 5\' 2"  (1.575 m), weight 175 lb 4.8 oz (79.5 kg), SpO2 97 %. ECOG: 1 General appearance: alert and cooperative  Head: Normocephalic, without obvious abnormality Neck: no adenopathy Lymph nodes: Cervical, supraclavicular, and axillary nodes normal. Heart:regular rate and rhythm, S1, S2 normal, no murmur, click, rub or gallop Lung:chest clear, no wheezing, rales, normal symmetric air entry Abdomin: soft, non-tender, without masses or  organomegaly EXT:no erythema, induration, or nodules   Lab Results: Lab Results  Component Value Date   WBC 6.5 12/11/2015   HGB 7.6 (L) 12/11/2015   HCT 23.6 (L) 12/11/2015   MCV 90.4 12/11/2015   PLT 282 12/11/2015     Chemistry      Component Value Date/Time   NA 132 (L) 12/11/2015 1828   K 4.3 12/11/2015 1828   CL 102 12/11/2015 1828   CO2 24 12/11/2015 1828   BUN 41 (H) 12/11/2015 1828   CREATININE 1.72 (H) 12/11/2015 1828      Component Value Date/Time   CALCIUM 9.2 12/11/2015 1828   ALKPHOS 69 12/11/2015 1828   AST 15 12/11/2015 1828   ALT 13 (L) 12/11/2015 1828   BILITOT 0.2 (L) 12/11/2015 1828      Impression and Plan:  83 year old woman with the following issues:  1. Transitional cell carcinoma of the right renal genitourinary tract presented with hydronephrosis and lower pole of the kidney tumor in 2015. He was followed by a urologist in [place] recently established care with Dr. Pilar Jarvis. She presented with hematuria in 2017 with a hemoglobin drifting back to 7.6. Her hematuria have resolved and her hemoglobin in October 2017 was normal.  The risks and benefits of using salvage immunotherapy in this setting was discussed again. The logistics of administration of Pembrolizumab was discussed today. Complications include fatigue, tiredness, hypothyroidism, dermatitis, pneumonitis among other immune mediated side effects were reviewed. I explained to her that she will require an IV treatment every 3 weeks for the foreseeable future. The logistics of this treatment will be complicated by her upcoming trip to [place]. I offered her the option of starting treatment after she returns in October but she does not want to wait this long. I offered her starting treatment immediately and to administer as many cycles as possible before her departure and she can follow-up with an oncologist in [place] to resume therapy if she wishes to.   After discussion today, she is willing to  proceed with treatment at this time and will schedule start of therapy in the immediate future.  2. IV access: Peripheral veins will be used for the time being. Port-A-Cath was also discussed as an option if needed to.  3. Immune mediated side effects: We will monitor her closely with periodic TSH.  4. Follow-up: We are asked 2 weeks to start therapy.    Zola Button, MD 4/25/20182:03 PM

## 2016-11-06 NOTE — Progress Notes (Signed)
START ON PATHWAY REGIMEN - Bladder     A cycle is 21 days:     Pembrolizumab   **Always confirm dose/schedule in your pharmacy ordering system**    Patient Characteristics: Metastatic Disease, First Line, No Prior Neoadjuvant/Adjuvant Therapy AJCC M Category: M1 AJCC N Category: NX AJCC T Category: TX Current evidence of distant metastases? Yes AJCC 8 Stage Grouping: IVB Line of therapy: First Line Would you be surprised if this patient died  in the next year? I would be surprised if this patient died in the next year Prior Neoadjuvant/Adjuvant Therapy? No  Intent of Therapy: Non-Curative / Palliative Intent, Discussed with Patient

## 2016-11-06 NOTE — Telephone Encounter (Signed)
Gave patient AVS and calender per 4/25 los.  

## 2016-11-06 NOTE — Progress Notes (Signed)
I have discussed today's findings with the patient's daughter August Luz over the phone. I reviewed the goals of therapy with her today which remains palliative rather than curative. I also encouraged her to accompany her mother she can for  her education class as well as treatment if possible.

## 2016-11-12 DIAGNOSIS — E782 Mixed hyperlipidemia: Secondary | ICD-10-CM | POA: Diagnosis not present

## 2016-11-12 DIAGNOSIS — Z23 Encounter for immunization: Secondary | ICD-10-CM | POA: Diagnosis not present

## 2016-11-12 DIAGNOSIS — N183 Chronic kidney disease, stage 3 (moderate): Secondary | ICD-10-CM | POA: Diagnosis not present

## 2016-11-12 DIAGNOSIS — I129 Hypertensive chronic kidney disease with stage 1 through stage 4 chronic kidney disease, or unspecified chronic kidney disease: Secondary | ICD-10-CM | POA: Diagnosis not present

## 2016-11-12 DIAGNOSIS — R319 Hematuria, unspecified: Secondary | ICD-10-CM | POA: Diagnosis not present

## 2016-11-14 ENCOUNTER — Other Ambulatory Visit: Payer: Medicare Other

## 2016-11-20 ENCOUNTER — Other Ambulatory Visit: Payer: Medicare Other

## 2016-11-20 ENCOUNTER — Ambulatory Visit: Payer: Medicare Other

## 2016-12-02 DIAGNOSIS — C661 Malignant neoplasm of right ureter: Secondary | ICD-10-CM | POA: Diagnosis not present

## 2016-12-02 DIAGNOSIS — C7801 Secondary malignant neoplasm of right lung: Secondary | ICD-10-CM | POA: Diagnosis not present

## 2016-12-03 ENCOUNTER — Encounter: Payer: Self-pay | Admitting: *Deleted

## 2016-12-05 ENCOUNTER — Ambulatory Visit: Payer: Medicare Other | Admitting: Oncology

## 2016-12-11 ENCOUNTER — Ambulatory Visit: Payer: Medicare Other | Admitting: Oncology

## 2016-12-11 ENCOUNTER — Other Ambulatory Visit: Payer: Medicare Other

## 2016-12-11 ENCOUNTER — Ambulatory Visit: Payer: Medicare Other

## 2016-12-31 DIAGNOSIS — I10 Essential (primary) hypertension: Secondary | ICD-10-CM | POA: Diagnosis not present

## 2016-12-31 DIAGNOSIS — C801 Malignant (primary) neoplasm, unspecified: Secondary | ICD-10-CM | POA: Diagnosis not present

## 2016-12-31 DIAGNOSIS — E559 Vitamin D deficiency, unspecified: Secondary | ICD-10-CM | POA: Diagnosis not present

## 2016-12-31 DIAGNOSIS — M5030 Other cervical disc degeneration, unspecified cervical region: Secondary | ICD-10-CM | POA: Diagnosis not present

## 2016-12-31 DIAGNOSIS — D500 Iron deficiency anemia secondary to blood loss (chronic): Secondary | ICD-10-CM | POA: Diagnosis not present

## 2016-12-31 DIAGNOSIS — N183 Chronic kidney disease, stage 3 (moderate): Secondary | ICD-10-CM | POA: Diagnosis not present

## 2016-12-31 DIAGNOSIS — C651 Malignant neoplasm of right renal pelvis: Secondary | ICD-10-CM | POA: Diagnosis not present

## 2016-12-31 DIAGNOSIS — Z8619 Personal history of other infectious and parasitic diseases: Secondary | ICD-10-CM | POA: Diagnosis not present

## 2016-12-31 DIAGNOSIS — H359 Unspecified retinal disorder: Secondary | ICD-10-CM | POA: Diagnosis not present

## 2016-12-31 DIAGNOSIS — E668 Other obesity: Secondary | ICD-10-CM | POA: Diagnosis not present

## 2016-12-31 DIAGNOSIS — D649 Anemia, unspecified: Secondary | ICD-10-CM | POA: Diagnosis not present

## 2016-12-31 DIAGNOSIS — E785 Hyperlipidemia, unspecified: Secondary | ICD-10-CM | POA: Diagnosis not present

## 2017-01-02 DIAGNOSIS — R269 Unspecified abnormalities of gait and mobility: Secondary | ICD-10-CM | POA: Diagnosis not present

## 2017-01-02 DIAGNOSIS — H5319 Other subjective visual disturbances: Secondary | ICD-10-CM | POA: Diagnosis not present

## 2017-01-02 DIAGNOSIS — R2689 Other abnormalities of gait and mobility: Secondary | ICD-10-CM | POA: Diagnosis not present

## 2017-01-02 DIAGNOSIS — M6281 Muscle weakness (generalized): Secondary | ICD-10-CM | POA: Diagnosis not present

## 2017-01-09 DIAGNOSIS — R1011 Right upper quadrant pain: Secondary | ICD-10-CM | POA: Diagnosis not present

## 2017-01-09 DIAGNOSIS — C7801 Secondary malignant neoplasm of right lung: Secondary | ICD-10-CM | POA: Diagnosis not present

## 2017-01-09 DIAGNOSIS — N1330 Unspecified hydronephrosis: Secondary | ICD-10-CM | POA: Diagnosis not present

## 2017-01-09 DIAGNOSIS — R109 Unspecified abdominal pain: Secondary | ICD-10-CM | POA: Diagnosis not present

## 2017-01-10 DIAGNOSIS — R109 Unspecified abdominal pain: Secondary | ICD-10-CM | POA: Diagnosis not present

## 2017-01-16 DIAGNOSIS — R109 Unspecified abdominal pain: Secondary | ICD-10-CM | POA: Diagnosis not present

## 2017-01-16 DIAGNOSIS — N183 Chronic kidney disease, stage 3 (moderate): Secondary | ICD-10-CM | POA: Diagnosis not present

## 2017-01-16 DIAGNOSIS — M899 Disorder of bone, unspecified: Secondary | ICD-10-CM | POA: Diagnosis not present

## 2017-01-16 DIAGNOSIS — C641 Malignant neoplasm of right kidney, except renal pelvis: Secondary | ICD-10-CM | POA: Diagnosis not present

## 2017-01-23 DIAGNOSIS — D649 Anemia, unspecified: Secondary | ICD-10-CM | POA: Diagnosis not present

## 2017-01-27 DIAGNOSIS — H348312 Tributary (branch) retinal vein occlusion, right eye, stable: Secondary | ICD-10-CM | POA: Diagnosis not present

## 2017-01-27 DIAGNOSIS — H35372 Puckering of macula, left eye: Secondary | ICD-10-CM | POA: Diagnosis not present

## 2017-01-27 DIAGNOSIS — H31001 Unspecified chorioretinal scars, right eye: Secondary | ICD-10-CM | POA: Diagnosis not present

## 2017-01-29 DIAGNOSIS — D509 Iron deficiency anemia, unspecified: Secondary | ICD-10-CM | POA: Diagnosis not present

## 2017-01-29 DIAGNOSIS — C680 Malignant neoplasm of urethra: Secondary | ICD-10-CM | POA: Diagnosis not present

## 2017-01-29 DIAGNOSIS — D649 Anemia, unspecified: Secondary | ICD-10-CM | POA: Diagnosis not present

## 2017-01-29 DIAGNOSIS — Z5112 Encounter for antineoplastic immunotherapy: Secondary | ICD-10-CM | POA: Diagnosis not present

## 2017-02-06 DIAGNOSIS — E860 Dehydration: Secondary | ICD-10-CM | POA: Diagnosis not present

## 2017-02-06 DIAGNOSIS — R112 Nausea with vomiting, unspecified: Secondary | ICD-10-CM | POA: Diagnosis not present

## 2017-02-06 DIAGNOSIS — C680 Malignant neoplasm of urethra: Secondary | ICD-10-CM | POA: Diagnosis not present

## 2017-02-06 DIAGNOSIS — D649 Anemia, unspecified: Secondary | ICD-10-CM | POA: Diagnosis not present

## 2017-02-11 DIAGNOSIS — M899 Disorder of bone, unspecified: Secondary | ICD-10-CM | POA: Diagnosis not present

## 2017-02-11 DIAGNOSIS — N183 Chronic kidney disease, stage 3 (moderate): Secondary | ICD-10-CM | POA: Diagnosis not present

## 2017-02-11 DIAGNOSIS — C651 Malignant neoplasm of right renal pelvis: Secondary | ICD-10-CM | POA: Diagnosis not present

## 2017-02-11 DIAGNOSIS — R413 Other amnesia: Secondary | ICD-10-CM | POA: Diagnosis not present

## 2017-02-19 DIAGNOSIS — E785 Hyperlipidemia, unspecified: Secondary | ICD-10-CM | POA: Diagnosis not present

## 2017-02-19 DIAGNOSIS — Z5112 Encounter for antineoplastic immunotherapy: Secondary | ICD-10-CM | POA: Diagnosis not present

## 2017-02-19 DIAGNOSIS — D649 Anemia, unspecified: Secondary | ICD-10-CM | POA: Diagnosis not present

## 2017-02-19 DIAGNOSIS — E860 Dehydration: Secondary | ICD-10-CM | POA: Diagnosis not present

## 2017-02-19 DIAGNOSIS — R112 Nausea with vomiting, unspecified: Secondary | ICD-10-CM | POA: Diagnosis not present

## 2017-02-19 DIAGNOSIS — C680 Malignant neoplasm of urethra: Secondary | ICD-10-CM | POA: Diagnosis not present

## 2017-02-25 DIAGNOSIS — C651 Malignant neoplasm of right renal pelvis: Secondary | ICD-10-CM | POA: Diagnosis not present

## 2017-03-02 DIAGNOSIS — C7802 Secondary malignant neoplasm of left lung: Secondary | ICD-10-CM | POA: Diagnosis not present

## 2017-03-02 DIAGNOSIS — I129 Hypertensive chronic kidney disease with stage 1 through stage 4 chronic kidney disease, or unspecified chronic kidney disease: Secondary | ICD-10-CM | POA: Diagnosis not present

## 2017-03-02 DIAGNOSIS — E785 Hyperlipidemia, unspecified: Secondary | ICD-10-CM | POA: Diagnosis not present

## 2017-03-02 DIAGNOSIS — D62 Acute posthemorrhagic anemia: Secondary | ICD-10-CM | POA: Diagnosis not present

## 2017-03-02 DIAGNOSIS — Z66 Do not resuscitate: Secondary | ICD-10-CM | POA: Diagnosis not present

## 2017-03-02 DIAGNOSIS — C651 Malignant neoplasm of right renal pelvis: Secondary | ICD-10-CM | POA: Diagnosis not present

## 2017-03-02 DIAGNOSIS — I10 Essential (primary) hypertension: Secondary | ICD-10-CM | POA: Diagnosis not present

## 2017-03-02 DIAGNOSIS — G3184 Mild cognitive impairment, so stated: Secondary | ICD-10-CM | POA: Diagnosis not present

## 2017-03-02 DIAGNOSIS — N179 Acute kidney failure, unspecified: Secondary | ICD-10-CM | POA: Diagnosis not present

## 2017-03-02 DIAGNOSIS — N189 Chronic kidney disease, unspecified: Secondary | ICD-10-CM | POA: Diagnosis not present

## 2017-03-02 DIAGNOSIS — K922 Gastrointestinal hemorrhage, unspecified: Secondary | ICD-10-CM | POA: Diagnosis not present

## 2017-03-02 DIAGNOSIS — K2980 Duodenitis without bleeding: Secondary | ICD-10-CM | POA: Diagnosis not present

## 2017-03-02 DIAGNOSIS — C7801 Secondary malignant neoplasm of right lung: Secondary | ICD-10-CM | POA: Diagnosis not present

## 2017-03-03 DIAGNOSIS — K922 Gastrointestinal hemorrhage, unspecified: Secondary | ICD-10-CM | POA: Diagnosis present

## 2017-03-03 DIAGNOSIS — Z9114 Patient's other noncompliance with medication regimen: Secondary | ICD-10-CM | POA: Diagnosis not present

## 2017-03-03 DIAGNOSIS — I129 Hypertensive chronic kidney disease with stage 1 through stage 4 chronic kidney disease, or unspecified chronic kidney disease: Secondary | ICD-10-CM | POA: Diagnosis present

## 2017-03-03 DIAGNOSIS — C7801 Secondary malignant neoplasm of right lung: Secondary | ICD-10-CM | POA: Diagnosis present

## 2017-03-03 DIAGNOSIS — Z880 Allergy status to penicillin: Secondary | ICD-10-CM | POA: Diagnosis not present

## 2017-03-03 DIAGNOSIS — E785 Hyperlipidemia, unspecified: Secondary | ICD-10-CM | POA: Diagnosis present

## 2017-03-03 DIAGNOSIS — K2980 Duodenitis without bleeding: Secondary | ICD-10-CM | POA: Diagnosis present

## 2017-03-03 DIAGNOSIS — G3184 Mild cognitive impairment, so stated: Secondary | ICD-10-CM | POA: Diagnosis present

## 2017-03-03 DIAGNOSIS — Z91041 Radiographic dye allergy status: Secondary | ICD-10-CM | POA: Diagnosis not present

## 2017-03-03 DIAGNOSIS — K3189 Other diseases of stomach and duodenum: Secondary | ICD-10-CM | POA: Diagnosis not present

## 2017-03-03 DIAGNOSIS — D649 Anemia, unspecified: Secondary | ICD-10-CM | POA: Diagnosis not present

## 2017-03-03 DIAGNOSIS — C651 Malignant neoplasm of right renal pelvis: Secondary | ICD-10-CM | POA: Diagnosis present

## 2017-03-03 DIAGNOSIS — N189 Chronic kidney disease, unspecified: Secondary | ICD-10-CM | POA: Diagnosis present

## 2017-03-03 DIAGNOSIS — Z66 Do not resuscitate: Secondary | ICD-10-CM | POA: Diagnosis present

## 2017-03-03 DIAGNOSIS — K449 Diaphragmatic hernia without obstruction or gangrene: Secondary | ICD-10-CM | POA: Diagnosis present

## 2017-03-03 DIAGNOSIS — C7802 Secondary malignant neoplasm of left lung: Secondary | ICD-10-CM | POA: Diagnosis present

## 2017-03-03 DIAGNOSIS — K222 Esophageal obstruction: Secondary | ICD-10-CM | POA: Diagnosis not present

## 2017-03-03 DIAGNOSIS — K228 Other specified diseases of esophagus: Secondary | ICD-10-CM | POA: Diagnosis present

## 2017-03-03 DIAGNOSIS — Z87891 Personal history of nicotine dependence: Secondary | ICD-10-CM | POA: Diagnosis not present

## 2017-03-03 DIAGNOSIS — Z9119 Patient's noncompliance with other medical treatment and regimen: Secondary | ICD-10-CM | POA: Diagnosis not present

## 2017-03-03 DIAGNOSIS — K2950 Unspecified chronic gastritis without bleeding: Secondary | ICD-10-CM | POA: Diagnosis not present

## 2017-03-03 DIAGNOSIS — D62 Acute posthemorrhagic anemia: Secondary | ICD-10-CM | POA: Diagnosis present

## 2017-03-03 DIAGNOSIS — N179 Acute kidney failure, unspecified: Secondary | ICD-10-CM | POA: Diagnosis present

## 2017-03-03 DIAGNOSIS — K2970 Gastritis, unspecified, without bleeding: Secondary | ICD-10-CM | POA: Diagnosis present

## 2017-03-07 DIAGNOSIS — N179 Acute kidney failure, unspecified: Secondary | ICD-10-CM | POA: Diagnosis not present

## 2017-03-07 DIAGNOSIS — M899 Disorder of bone, unspecified: Secondary | ICD-10-CM | POA: Diagnosis not present

## 2017-03-07 DIAGNOSIS — N183 Chronic kidney disease, stage 3 (moderate): Secondary | ICD-10-CM | POA: Diagnosis not present

## 2017-03-07 DIAGNOSIS — R0789 Other chest pain: Secondary | ICD-10-CM | POA: Diagnosis not present

## 2017-03-07 DIAGNOSIS — C651 Malignant neoplasm of right renal pelvis: Secondary | ICD-10-CM | POA: Diagnosis not present

## 2017-03-07 DIAGNOSIS — R413 Other amnesia: Secondary | ICD-10-CM | POA: Diagnosis not present

## 2017-03-07 DIAGNOSIS — D649 Anemia, unspecified: Secondary | ICD-10-CM | POA: Diagnosis not present

## 2017-03-07 DIAGNOSIS — I10 Essential (primary) hypertension: Secondary | ICD-10-CM | POA: Diagnosis not present

## 2017-03-07 DIAGNOSIS — R0781 Pleurodynia: Secondary | ICD-10-CM | POA: Diagnosis not present

## 2017-03-07 DIAGNOSIS — K922 Gastrointestinal hemorrhage, unspecified: Secondary | ICD-10-CM | POA: Diagnosis not present

## 2017-03-07 DIAGNOSIS — D62 Acute posthemorrhagic anemia: Secondary | ICD-10-CM | POA: Diagnosis not present

## 2017-03-12 DIAGNOSIS — Z880 Allergy status to penicillin: Secondary | ICD-10-CM | POA: Diagnosis not present

## 2017-03-12 DIAGNOSIS — Z66 Do not resuscitate: Secondary | ICD-10-CM | POA: Diagnosis not present

## 2017-03-12 DIAGNOSIS — R109 Unspecified abdominal pain: Secondary | ICD-10-CM | POA: Diagnosis not present

## 2017-03-12 DIAGNOSIS — E785 Hyperlipidemia, unspecified: Secondary | ICD-10-CM | POA: Diagnosis not present

## 2017-03-12 DIAGNOSIS — R112 Nausea with vomiting, unspecified: Secondary | ICD-10-CM | POA: Diagnosis not present

## 2017-03-12 DIAGNOSIS — Z91041 Radiographic dye allergy status: Secondary | ICD-10-CM | POA: Diagnosis not present

## 2017-03-12 DIAGNOSIS — C7800 Secondary malignant neoplasm of unspecified lung: Secondary | ICD-10-CM | POA: Diagnosis not present

## 2017-03-12 DIAGNOSIS — K922 Gastrointestinal hemorrhage, unspecified: Secondary | ICD-10-CM | POA: Diagnosis not present

## 2017-03-12 DIAGNOSIS — N183 Chronic kidney disease, stage 3 (moderate): Secondary | ICD-10-CM | POA: Diagnosis not present

## 2017-03-12 DIAGNOSIS — C651 Malignant neoplasm of right renal pelvis: Secondary | ICD-10-CM | POA: Diagnosis not present

## 2017-03-12 DIAGNOSIS — I129 Hypertensive chronic kidney disease with stage 1 through stage 4 chronic kidney disease, or unspecified chronic kidney disease: Secondary | ICD-10-CM | POA: Diagnosis not present

## 2017-03-12 DIAGNOSIS — D649 Anemia, unspecified: Secondary | ICD-10-CM | POA: Diagnosis not present

## 2017-03-12 DIAGNOSIS — C680 Malignant neoplasm of urethra: Secondary | ICD-10-CM | POA: Diagnosis not present

## 2017-03-12 DIAGNOSIS — Z87891 Personal history of nicotine dependence: Secondary | ICD-10-CM | POA: Diagnosis not present

## 2017-03-12 DIAGNOSIS — Z515 Encounter for palliative care: Secondary | ICD-10-CM | POA: Diagnosis not present

## 2017-03-12 DIAGNOSIS — G3184 Mild cognitive impairment, so stated: Secondary | ICD-10-CM | POA: Diagnosis not present

## 2017-03-12 DIAGNOSIS — Z7189 Other specified counseling: Secondary | ICD-10-CM | POA: Diagnosis not present

## 2017-03-12 DIAGNOSIS — C689 Malignant neoplasm of urinary organ, unspecified: Secondary | ICD-10-CM | POA: Diagnosis not present

## 2017-03-12 DIAGNOSIS — G893 Neoplasm related pain (acute) (chronic): Secondary | ICD-10-CM | POA: Diagnosis not present

## 2017-03-12 DIAGNOSIS — D62 Acute posthemorrhagic anemia: Secondary | ICD-10-CM | POA: Diagnosis not present

## 2017-03-15 DIAGNOSIS — Z87891 Personal history of nicotine dependence: Secondary | ICD-10-CM | POA: Diagnosis not present

## 2017-03-15 DIAGNOSIS — Z66 Do not resuscitate: Secondary | ICD-10-CM | POA: Diagnosis not present

## 2017-03-15 DIAGNOSIS — J449 Chronic obstructive pulmonary disease, unspecified: Secondary | ICD-10-CM | POA: Diagnosis not present

## 2017-03-15 DIAGNOSIS — N183 Chronic kidney disease, stage 3 (moderate): Secondary | ICD-10-CM | POA: Diagnosis not present

## 2017-03-15 DIAGNOSIS — Z85528 Personal history of other malignant neoplasm of kidney: Secondary | ICD-10-CM | POA: Diagnosis not present

## 2017-03-15 DIAGNOSIS — Z880 Allergy status to penicillin: Secondary | ICD-10-CM | POA: Diagnosis not present

## 2017-03-15 DIAGNOSIS — Z515 Encounter for palliative care: Secondary | ICD-10-CM | POA: Diagnosis not present

## 2017-03-15 DIAGNOSIS — R079 Chest pain, unspecified: Secondary | ICD-10-CM | POA: Diagnosis not present

## 2017-03-15 DIAGNOSIS — I129 Hypertensive chronic kidney disease with stage 1 through stage 4 chronic kidney disease, or unspecified chronic kidney disease: Secondary | ICD-10-CM | POA: Diagnosis not present

## 2017-03-15 DIAGNOSIS — C689 Malignant neoplasm of urinary organ, unspecified: Secondary | ICD-10-CM | POA: Diagnosis not present

## 2017-03-15 DIAGNOSIS — C7802 Secondary malignant neoplasm of left lung: Secondary | ICD-10-CM | POA: Diagnosis not present

## 2017-03-15 DIAGNOSIS — E785 Hyperlipidemia, unspecified: Secondary | ICD-10-CM | POA: Diagnosis not present

## 2017-03-15 DIAGNOSIS — C651 Malignant neoplasm of right renal pelvis: Secondary | ICD-10-CM | POA: Diagnosis not present

## 2017-03-15 DIAGNOSIS — K922 Gastrointestinal hemorrhage, unspecified: Secondary | ICD-10-CM | POA: Diagnosis not present

## 2017-03-15 DIAGNOSIS — C7801 Secondary malignant neoplasm of right lung: Secondary | ICD-10-CM | POA: Diagnosis not present

## 2017-03-15 DIAGNOSIS — D649 Anemia, unspecified: Secondary | ICD-10-CM | POA: Diagnosis not present

## 2017-03-15 DIAGNOSIS — Z923 Personal history of irradiation: Secondary | ICD-10-CM | POA: Diagnosis not present

## 2017-03-15 DIAGNOSIS — C787 Secondary malignant neoplasm of liver and intrahepatic bile duct: Secondary | ICD-10-CM | POA: Diagnosis not present

## 2017-03-15 DIAGNOSIS — G3184 Mild cognitive impairment, so stated: Secondary | ICD-10-CM | POA: Diagnosis not present

## 2017-03-15 DIAGNOSIS — R109 Unspecified abdominal pain: Secondary | ICD-10-CM | POA: Diagnosis not present

## 2017-03-15 DIAGNOSIS — R627 Adult failure to thrive: Secondary | ICD-10-CM | POA: Diagnosis not present

## 2017-03-15 DIAGNOSIS — C7800 Secondary malignant neoplasm of unspecified lung: Secondary | ICD-10-CM | POA: Diagnosis not present

## 2017-03-16 DIAGNOSIS — I129 Hypertensive chronic kidney disease with stage 1 through stage 4 chronic kidney disease, or unspecified chronic kidney disease: Secondary | ICD-10-CM | POA: Diagnosis not present

## 2017-03-16 DIAGNOSIS — J449 Chronic obstructive pulmonary disease, unspecified: Secondary | ICD-10-CM | POA: Diagnosis not present

## 2017-03-16 DIAGNOSIS — C7801 Secondary malignant neoplasm of right lung: Secondary | ICD-10-CM | POA: Diagnosis not present

## 2017-03-16 DIAGNOSIS — E785 Hyperlipidemia, unspecified: Secondary | ICD-10-CM | POA: Diagnosis not present

## 2017-03-16 DIAGNOSIS — G3184 Mild cognitive impairment, so stated: Secondary | ICD-10-CM | POA: Diagnosis not present

## 2017-03-16 DIAGNOSIS — Z515 Encounter for palliative care: Secondary | ICD-10-CM | POA: Diagnosis not present

## 2017-03-16 DIAGNOSIS — C689 Malignant neoplasm of urinary organ, unspecified: Secondary | ICD-10-CM | POA: Diagnosis not present

## 2017-03-16 DIAGNOSIS — R079 Chest pain, unspecified: Secondary | ICD-10-CM | POA: Diagnosis not present

## 2017-03-16 DIAGNOSIS — C7802 Secondary malignant neoplasm of left lung: Secondary | ICD-10-CM | POA: Diagnosis not present

## 2017-03-16 DIAGNOSIS — K922 Gastrointestinal hemorrhage, unspecified: Secondary | ICD-10-CM | POA: Diagnosis not present

## 2017-03-16 DIAGNOSIS — R627 Adult failure to thrive: Secondary | ICD-10-CM | POA: Diagnosis not present

## 2017-03-16 DIAGNOSIS — Z923 Personal history of irradiation: Secondary | ICD-10-CM | POA: Diagnosis not present

## 2017-03-16 DIAGNOSIS — D649 Anemia, unspecified: Secondary | ICD-10-CM | POA: Diagnosis not present

## 2017-03-16 DIAGNOSIS — Z66 Do not resuscitate: Secondary | ICD-10-CM | POA: Diagnosis not present

## 2017-03-16 DIAGNOSIS — C787 Secondary malignant neoplasm of liver and intrahepatic bile duct: Secondary | ICD-10-CM | POA: Diagnosis not present

## 2017-03-16 DIAGNOSIS — Z880 Allergy status to penicillin: Secondary | ICD-10-CM | POA: Diagnosis not present

## 2017-03-16 DIAGNOSIS — C651 Malignant neoplasm of right renal pelvis: Secondary | ICD-10-CM | POA: Diagnosis not present

## 2017-03-16 DIAGNOSIS — N183 Chronic kidney disease, stage 3 (moderate): Secondary | ICD-10-CM | POA: Diagnosis not present

## 2017-03-17 DIAGNOSIS — R079 Chest pain, unspecified: Secondary | ICD-10-CM | POA: Diagnosis not present

## 2017-03-18 DIAGNOSIS — R079 Chest pain, unspecified: Secondary | ICD-10-CM | POA: Diagnosis present

## 2017-03-18 DIAGNOSIS — Z7189 Other specified counseling: Secondary | ICD-10-CM | POA: Diagnosis not present

## 2017-03-18 DIAGNOSIS — H269 Unspecified cataract: Secondary | ICD-10-CM | POA: Diagnosis present

## 2017-03-18 DIAGNOSIS — R627 Adult failure to thrive: Secondary | ICD-10-CM | POA: Diagnosis present

## 2017-03-18 DIAGNOSIS — D649 Anemia, unspecified: Secondary | ICD-10-CM | POA: Diagnosis present

## 2017-03-18 DIAGNOSIS — Z923 Personal history of irradiation: Secondary | ICD-10-CM | POA: Diagnosis not present

## 2017-03-18 DIAGNOSIS — D500 Iron deficiency anemia secondary to blood loss (chronic): Secondary | ICD-10-CM | POA: Diagnosis not present

## 2017-03-18 DIAGNOSIS — R0781 Pleurodynia: Secondary | ICD-10-CM | POA: Diagnosis not present

## 2017-03-18 DIAGNOSIS — C7800 Secondary malignant neoplasm of unspecified lung: Secondary | ICD-10-CM | POA: Diagnosis not present

## 2017-03-18 DIAGNOSIS — I129 Hypertensive chronic kidney disease with stage 1 through stage 4 chronic kidney disease, or unspecified chronic kidney disease: Secondary | ICD-10-CM | POA: Diagnosis present

## 2017-03-18 DIAGNOSIS — Z880 Allergy status to penicillin: Secondary | ICD-10-CM | POA: Diagnosis not present

## 2017-03-18 DIAGNOSIS — R1010 Upper abdominal pain, unspecified: Secondary | ICD-10-CM | POA: Diagnosis not present

## 2017-03-18 DIAGNOSIS — Z515 Encounter for palliative care: Secondary | ICD-10-CM | POA: Diagnosis present

## 2017-03-18 DIAGNOSIS — K922 Gastrointestinal hemorrhage, unspecified: Secondary | ICD-10-CM | POA: Diagnosis present

## 2017-03-18 DIAGNOSIS — J449 Chronic obstructive pulmonary disease, unspecified: Secondary | ICD-10-CM | POA: Diagnosis present

## 2017-03-18 DIAGNOSIS — C689 Malignant neoplasm of urinary organ, unspecified: Secondary | ICD-10-CM | POA: Diagnosis not present

## 2017-03-18 DIAGNOSIS — Z85528 Personal history of other malignant neoplasm of kidney: Secondary | ICD-10-CM | POA: Diagnosis not present

## 2017-03-18 DIAGNOSIS — R109 Unspecified abdominal pain: Secondary | ICD-10-CM | POA: Diagnosis not present

## 2017-03-18 DIAGNOSIS — C7802 Secondary malignant neoplasm of left lung: Secondary | ICD-10-CM | POA: Diagnosis present

## 2017-03-18 DIAGNOSIS — Z66 Do not resuscitate: Secondary | ICD-10-CM | POA: Diagnosis present

## 2017-03-18 DIAGNOSIS — N183 Chronic kidney disease, stage 3 (moderate): Secondary | ICD-10-CM | POA: Diagnosis present

## 2017-03-18 DIAGNOSIS — G893 Neoplasm related pain (acute) (chronic): Secondary | ICD-10-CM | POA: Diagnosis not present

## 2017-03-18 DIAGNOSIS — Z91041 Radiographic dye allergy status: Secondary | ICD-10-CM | POA: Diagnosis not present

## 2017-03-18 DIAGNOSIS — C787 Secondary malignant neoplasm of liver and intrahepatic bile duct: Secondary | ICD-10-CM | POA: Diagnosis present

## 2017-03-18 DIAGNOSIS — C651 Malignant neoplasm of right renal pelvis: Secondary | ICD-10-CM | POA: Diagnosis present

## 2017-03-18 DIAGNOSIS — C679 Malignant neoplasm of bladder, unspecified: Secondary | ICD-10-CM | POA: Diagnosis not present

## 2017-03-18 DIAGNOSIS — G3184 Mild cognitive impairment, so stated: Secondary | ICD-10-CM | POA: Diagnosis present

## 2017-03-18 DIAGNOSIS — E785 Hyperlipidemia, unspecified: Secondary | ICD-10-CM | POA: Diagnosis present

## 2017-03-18 DIAGNOSIS — C7801 Secondary malignant neoplasm of right lung: Secondary | ICD-10-CM | POA: Diagnosis present

## 2017-03-21 DIAGNOSIS — I129 Hypertensive chronic kidney disease with stage 1 through stage 4 chronic kidney disease, or unspecified chronic kidney disease: Secondary | ICD-10-CM | POA: Diagnosis not present

## 2017-03-21 DIAGNOSIS — D649 Anemia, unspecified: Secondary | ICD-10-CM | POA: Diagnosis not present

## 2017-03-21 DIAGNOSIS — N183 Chronic kidney disease, stage 3 (moderate): Secondary | ICD-10-CM | POA: Diagnosis not present

## 2017-03-21 DIAGNOSIS — C689 Malignant neoplasm of urinary organ, unspecified: Secondary | ICD-10-CM | POA: Diagnosis not present

## 2017-03-21 DIAGNOSIS — J449 Chronic obstructive pulmonary disease, unspecified: Secondary | ICD-10-CM | POA: Diagnosis not present

## 2017-03-21 DIAGNOSIS — G3184 Mild cognitive impairment, so stated: Secondary | ICD-10-CM | POA: Diagnosis not present

## 2017-03-22 DIAGNOSIS — D649 Anemia, unspecified: Secondary | ICD-10-CM | POA: Diagnosis not present

## 2017-03-22 DIAGNOSIS — N183 Chronic kidney disease, stage 3 (moderate): Secondary | ICD-10-CM | POA: Diagnosis not present

## 2017-03-22 DIAGNOSIS — G3184 Mild cognitive impairment, so stated: Secondary | ICD-10-CM | POA: Diagnosis not present

## 2017-03-22 DIAGNOSIS — C689 Malignant neoplasm of urinary organ, unspecified: Secondary | ICD-10-CM | POA: Diagnosis not present

## 2017-03-22 DIAGNOSIS — I129 Hypertensive chronic kidney disease with stage 1 through stage 4 chronic kidney disease, or unspecified chronic kidney disease: Secondary | ICD-10-CM | POA: Diagnosis not present

## 2017-03-22 DIAGNOSIS — J449 Chronic obstructive pulmonary disease, unspecified: Secondary | ICD-10-CM | POA: Diagnosis not present

## 2017-03-23 DIAGNOSIS — C787 Secondary malignant neoplasm of liver and intrahepatic bile duct: Secondary | ICD-10-CM | POA: Diagnosis not present

## 2017-03-23 DIAGNOSIS — Q2730 Arteriovenous malformation, site unspecified: Secondary | ICD-10-CM | POA: Diagnosis not present

## 2017-03-23 DIAGNOSIS — H348112 Central retinal vein occlusion, right eye, stable: Secondary | ICD-10-CM | POA: Diagnosis not present

## 2017-03-23 DIAGNOSIS — C641 Malignant neoplasm of right kidney, except renal pelvis: Secondary | ICD-10-CM | POA: Diagnosis not present

## 2017-03-23 DIAGNOSIS — C7801 Secondary malignant neoplasm of right lung: Secondary | ICD-10-CM | POA: Diagnosis not present

## 2017-03-23 DIAGNOSIS — C689 Malignant neoplasm of urinary organ, unspecified: Secondary | ICD-10-CM | POA: Diagnosis not present

## 2017-03-23 DIAGNOSIS — Z7401 Bed confinement status: Secondary | ICD-10-CM | POA: Diagnosis not present

## 2017-03-23 DIAGNOSIS — N183 Chronic kidney disease, stage 3 (moderate): Secondary | ICD-10-CM | POA: Diagnosis not present

## 2017-03-23 DIAGNOSIS — I120 Hypertensive chronic kidney disease with stage 5 chronic kidney disease or end stage renal disease: Secondary | ICD-10-CM | POA: Diagnosis not present

## 2017-03-23 DIAGNOSIS — J449 Chronic obstructive pulmonary disease, unspecified: Secondary | ICD-10-CM | POA: Diagnosis not present

## 2017-03-23 DIAGNOSIS — K922 Gastrointestinal hemorrhage, unspecified: Secondary | ICD-10-CM | POA: Diagnosis not present

## 2017-03-23 DIAGNOSIS — I248 Other forms of acute ischemic heart disease: Secondary | ICD-10-CM | POA: Diagnosis not present

## 2017-03-23 DIAGNOSIS — I129 Hypertensive chronic kidney disease with stage 1 through stage 4 chronic kidney disease, or unspecified chronic kidney disease: Secondary | ICD-10-CM | POA: Diagnosis not present

## 2017-03-23 DIAGNOSIS — K219 Gastro-esophageal reflux disease without esophagitis: Secondary | ICD-10-CM | POA: Diagnosis not present

## 2017-03-23 DIAGNOSIS — R630 Anorexia: Secondary | ICD-10-CM | POA: Diagnosis not present

## 2017-03-23 DIAGNOSIS — G3184 Mild cognitive impairment, so stated: Secondary | ICD-10-CM | POA: Diagnosis not present

## 2017-03-23 DIAGNOSIS — C7802 Secondary malignant neoplasm of left lung: Secondary | ICD-10-CM | POA: Diagnosis not present

## 2017-03-23 DIAGNOSIS — G893 Neoplasm related pain (acute) (chronic): Secondary | ICD-10-CM | POA: Diagnosis not present

## 2017-03-23 DIAGNOSIS — D649 Anemia, unspecified: Secondary | ICD-10-CM | POA: Diagnosis not present

## 2017-03-23 DIAGNOSIS — R4181 Age-related cognitive decline: Secondary | ICD-10-CM | POA: Diagnosis not present

## 2017-03-23 DIAGNOSIS — R64 Cachexia: Secondary | ICD-10-CM | POA: Diagnosis not present

## 2017-03-23 DIAGNOSIS — D631 Anemia in chronic kidney disease: Secondary | ICD-10-CM | POA: Diagnosis not present

## 2017-03-23 DIAGNOSIS — E785 Hyperlipidemia, unspecified: Secondary | ICD-10-CM | POA: Diagnosis not present

## 2017-03-24 DIAGNOSIS — C7802 Secondary malignant neoplasm of left lung: Secondary | ICD-10-CM | POA: Diagnosis not present

## 2017-03-24 DIAGNOSIS — C7801 Secondary malignant neoplasm of right lung: Secondary | ICD-10-CM | POA: Diagnosis not present

## 2017-03-24 DIAGNOSIS — C787 Secondary malignant neoplasm of liver and intrahepatic bile duct: Secondary | ICD-10-CM | POA: Diagnosis not present

## 2017-03-24 DIAGNOSIS — N183 Chronic kidney disease, stage 3 (moderate): Secondary | ICD-10-CM | POA: Diagnosis not present

## 2017-03-24 DIAGNOSIS — C641 Malignant neoplasm of right kidney, except renal pelvis: Secondary | ICD-10-CM | POA: Diagnosis not present

## 2017-03-24 DIAGNOSIS — G893 Neoplasm related pain (acute) (chronic): Secondary | ICD-10-CM | POA: Diagnosis not present

## 2017-03-25 DIAGNOSIS — C641 Malignant neoplasm of right kidney, except renal pelvis: Secondary | ICD-10-CM | POA: Diagnosis not present

## 2017-03-25 DIAGNOSIS — G893 Neoplasm related pain (acute) (chronic): Secondary | ICD-10-CM | POA: Diagnosis not present

## 2017-03-25 DIAGNOSIS — C7802 Secondary malignant neoplasm of left lung: Secondary | ICD-10-CM | POA: Diagnosis not present

## 2017-03-25 DIAGNOSIS — C787 Secondary malignant neoplasm of liver and intrahepatic bile duct: Secondary | ICD-10-CM | POA: Diagnosis not present

## 2017-03-25 DIAGNOSIS — C7801 Secondary malignant neoplasm of right lung: Secondary | ICD-10-CM | POA: Diagnosis not present

## 2017-03-25 DIAGNOSIS — N183 Chronic kidney disease, stage 3 (moderate): Secondary | ICD-10-CM | POA: Diagnosis not present

## 2017-04-14 DEATH — deceased

## 2018-08-20 ENCOUNTER — Other Ambulatory Visit: Payer: Self-pay | Admitting: Nurse Practitioner
